# Patient Record
Sex: Female | Born: 1985 | Race: Black or African American | Hispanic: No | Marital: Single | Smoking: Current every day smoker
Health system: Southern US, Community
[De-identification: ages and names within clinical notes are randomized; demographics above are authoritative.]

## PROBLEM LIST (undated history)

## (undated) DIAGNOSIS — L732 Hidradenitis suppurativa: Secondary | ICD-10-CM

## (undated) DIAGNOSIS — E669 Obesity, unspecified: Secondary | ICD-10-CM

## (undated) DIAGNOSIS — R0683 Snoring: Secondary | ICD-10-CM

## (undated) DIAGNOSIS — E8881 Metabolic syndrome: Secondary | ICD-10-CM

## (undated) DIAGNOSIS — I10 Essential (primary) hypertension: Secondary | ICD-10-CM

## (undated) DIAGNOSIS — E88819 Insulin resistance, unspecified: Secondary | ICD-10-CM

## (undated) HISTORY — PX: NO PAST SURGERIES: SHX2092

## (undated) HISTORY — DX: Essential (primary) hypertension: I10

## (undated) HISTORY — DX: Obesity, unspecified: E66.9

## (undated) HISTORY — DX: Snoring: R06.83

---

## 2016-04-11 ENCOUNTER — Telehealth: Payer: Self-pay

## 2016-04-11 NOTE — Telephone Encounter (Signed)
I called pt to r/s her appt on 04/14/2016 because Dr. Vickey Hugerohmeier is not seeing pt's that day. No answer, left a message asking her to call me back. If pt calls back, please r/s her for a sleep consult with Dr. Vickey Hugerohmeier.

## 2016-04-14 ENCOUNTER — Institutional Professional Consult (permissible substitution): Payer: 59 | Admitting: Neurology

## 2016-04-27 ENCOUNTER — Ambulatory Visit (INDEPENDENT_AMBULATORY_CARE_PROVIDER_SITE_OTHER): Payer: 59 | Admitting: Neurology

## 2016-04-27 ENCOUNTER — Encounter: Payer: Self-pay | Admitting: Neurology

## 2016-04-27 VITALS — BP 146/96 | HR 92 | Resp 20 | Ht 65.0 in | Wt 283.0 lb

## 2016-04-27 DIAGNOSIS — G4733 Obstructive sleep apnea (adult) (pediatric): Secondary | ICD-10-CM | POA: Diagnosis not present

## 2016-04-27 DIAGNOSIS — E669 Obesity, unspecified: Secondary | ICD-10-CM

## 2016-04-27 DIAGNOSIS — R0689 Other abnormalities of breathing: Secondary | ICD-10-CM

## 2016-04-27 NOTE — Patient Instructions (Signed)
Home sleep test ordered 

## 2016-04-27 NOTE — Progress Notes (Signed)
SLEEP MEDICINE CLINIC   Provider:  Melvyn Novasarmen  Dillie Burandt, M D  Referring Provider: Glennis BrinkBowen, Karen E, DO Primary Care Physician:  Rhetta MuraHENRY ALTHISAR, PA-C  Chief Complaint  Patient presents with  . New Patient (Initial Visit)    snoring, never had stop breathing    HPI:  Michelle Morse is a 30 y.o. female , seen here as a referral  from Dr. Cathey EndowBowen for A sleep medicine evaluation.  Ms. Michelle Morse is followed by Dr. Cathey EndowBowen in her bariatric clinic, who has referred the patient because of a high-grade Mallampati, class III obesity with a BMI of 49, and a comorbidity of hypertension. The patient also has admitted and has been told that she snores. The patient stated that she always had trouble initiating sleep, and that her mind was often too busy to relax and enter sleep. She tends to worry a lot at night.  Michelle Morse started to lose weight briefly when she was prescribed phentermine in 2015  but it did not work well for her in the long-term. Actually, the patient reports that she was taken off phentermine also she had started to lose weight but her physician at the time didn't want her to be using this medication long-term. She just recently started the medication again and lost already 15 pounds over a period of 2 months. Besides stent to mind at 37.5 mg in the mornings she takes biotin, BuSpar 50 mg, hydrochlorothiazide 12.5 mg in the morning she is also on a birth control pill, Gilda.  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 She was worrying a lot about her clients and she worked in a stressful environment, witnessing some conditions that left their mark on her.   Chief complaint according to patient : "I have never slept easily "  Sleep habits are as follows: The patient usually likes to go to bed before 10 PM, but she struggles to fall asleep once in bed. The bedroom is core quiet and dark, she prefers to sleep on her  side, she uses for 5 pillows at night. She likes to sleep on her right side with the head of bed slightly elevated. She denies any restless legs, and once she is asleep she can stay asleep for 5 hours but tends to wake up in the early morning hours. Some nights she wakes up because she has to go to the bathroom of the night she wakes up spontaneously not sure why. Sometimes she may have a vivid dream but this is not a frequent occurrence and usually she does not suffer from nightmares, nocturnal panic or anxiety. Some nights it will take an hour or 2 to go back to sleep after an unwanted arousal from sleep, the nights she can fall asleep without a problem. She rises in the morning usually at 7:30 AM and her work begins at 8:30 AM. She has a brief commute. Some mornings she will wake with a dry mouth but usually not with headaches, not with any pain or discomfort. She will have breakfast at work usually oatmeal for breakfast, she does not drink caffeine or caffeine beverages.    Sleep medical history and family sleep history:  She has no history of sleepwalking, night terrors or enuresis. No parasomnia. 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   Review of Systems: Out of a complete 14 system review, the patient complains of only the following symptoms, and all other reviewed systems are negative.  She endorsed insomnia, snoring, headaches, and weight gain. But yet recently has lost some weight. She has been startled and wakes up finding herself supine feeling as if her throat closed up, sometimes choking.  Epworth score 3, Fatigue severity score 27  , depression score n/a    Social History   Social History  . Marital status: Single    Spouse name: N/A  . Number of children: N/A  . Years of education: N/A    Occupational History  . Not on file.   Social History Main Topics  . Smoking status: Current Every Day Smoker    Packs/day: 0.25  . Smokeless tobacco: Never Used  . Alcohol use No  . Drug use: No  . Sexual activity: Not on file   Other Topics Concern  . Not on file   Social History Narrative  . No narrative on file    Family History  Problem Relation Age of Onset  . Cancer Mother   . Hypertension Mother   . Cancer Father   . Hypertension Father   . Hypertension Sister     Past Medical History:  Diagnosis Date  . Hypertension   . Obesity   . Snoring     No past surgical history on file.  Current Outpatient Prescriptions  Medication Sig Dispense Refill  . Biotin 1 MG CAPS Take 1 mg by mouth daily.    . busPIRone (BUSPAR) 15 MG tablet Take 15 mg by mouth daily.     . hydrochlorothiazide (MICROZIDE) 12.5 MG capsule Take 12.5 mg by mouth daily.    . norethindrone-ethinyl estradiol (MICROGESTIN,JUNEL,LOESTRIN) 1-20 MG-MCG tablet Take 1 tablet by mouth daily.    . phentermine 37.5 MG capsule Take 37.5 mg by mouth every morning.     No current facility-administered medications for this visit.     Allergies as of 04/27/2016  . (No Known Allergies)    Vitals: BP (!) 146/96   Pulse 92   Resp 20   Ht 5\' 5"  (1.651 m)   Wt 283 lb (128.4 kg)   BMI 47.09 kg/m  Last Weight:  Wt Readings from Last 1 Encounters:  04/27/16 283 lb (128.4 kg)   ZOX:WRUEBMI:Body mass index is 47.09 kg/m.     Last Height:   Ht Readings from Last 1 Encounters:  04/27/16 5\' 5"  (1.651 m)    Physical exam:  General: The patient is awake, alert and appears not in acute distress. The patient is well groomed. Head: Normocephalic, atraumatic. Neck is supple. Mallampati 5,  neck circumference:15.5 . Nasal airflow patent , TMJ click is evident . Retrognathia is seen.  Cardiovascular:  Regular rate and rhythm , without  murmurs or carotid bruit, and without distended neck veins. Respiratory: Lungs  are clear to auscultation. Skin:  Without evidence of edema, or rash Trunk: BMI is 49 Neurologic exam :The patient is awake and alert, oriented to place and time.   Attention span & concentration ability appears normal.  Speech is fluent,  without dysarthria, dysphonia or aphasia. Mood and affect are appropriate.  Cranial nerves: Pupils are equal and briskly reactive to light. Funduscopic exam without  evidence of pallor or edema. Extraocular movements in vertical and horizontal planes intact . Visual fields by finger perimetry are intact. Hearing to finger rub  intact. Facial sensation intact to fine touch. Facial motor strength is symmetric and tongue and uvula move midline. Shoulder shrug was symmetrical.   Motor exam: Normal tone, muscle bulk and symmetric strength in all extremities. Sensory:  Fine touch, pinprick and vibration were tested in all extremities.  Coordination:  Finger-to-nose maneuver  normal without evidence of ataxia, dysmetria or tremor. Gait and station: Patient walks without assistive device and is able unassisted to climb up to the exam table. Strength within normal limits.Stance is stable and normal.  Deep tendon reflexes: in the  upper and lower extremities are attenuated, symmetric and intact. Babinski maneuver response is downgoing.  The patient was advised of the nature of the diagnosed sleep disorder , the treatment options and risks for general a health and wellness arising from not treating the condition.  I spent more than 45 minutes of face to face time with the patient. Greater than 50% of time was spent in counseling and coordination of care. We have discussed the diagnosis and differential and I answered the patient's questions.    Assessment:  After physical and neurologic examination, review of laboratory studies,  Personal review of imaging studies, reports of other /same  Imaging studies ,  Results of polysomnography/ neurophysiology testing and pre-existing  records as far as provided in visit., my assessment is   1)  I share Dr. Ovidio Kin concerns about this patient possibly having sleep apnea, there is a history of snoring, sleep choking and the most likely diagnosis is obstructive sleep apnea. Her risk factors are also manifested in a very high-grade Mallampati and BMI  2) I will asked this patient to undergo a home sleep test. I think this will be enough of a screening test to see if apnea is present. Today be significant sleep hypoxemia or very high degree of apnea found in her home sleep test I may ask her to return to the sleep lab for a CPAP titration. In mild the apnea cases without oxygen desaturation a dental device can be sufficient to treat snoring and apnea. The patient is already in bariatric treatment, and aware that her body mass index constitutes the highest risk factor for this condition.    Plan:  Treatment plan and additional workup :  HST   Rv after test.      Melvyn Novas MD  04/27/2016   CC: Glennis Brink, Do [address] [place], [place] [address]

## 2016-06-30 ENCOUNTER — Telehealth: Payer: Self-pay | Admitting: Neurology

## 2016-06-30 DIAGNOSIS — G4733 Obstructive sleep apnea (adult) (pediatric): Secondary | ICD-10-CM

## 2016-06-30 NOTE — Telephone Encounter (Signed)
Patient wants in lab sleep study and I have it approved.  I need an order for split.  Thanks

## 2016-07-11 ENCOUNTER — Ambulatory Visit (INDEPENDENT_AMBULATORY_CARE_PROVIDER_SITE_OTHER): Payer: 59 | Admitting: Neurology

## 2016-07-11 DIAGNOSIS — G4733 Obstructive sleep apnea (adult) (pediatric): Secondary | ICD-10-CM | POA: Diagnosis not present

## 2016-07-18 NOTE — Procedures (Signed)
PATIENT'S NAME:  Michelle Morse, Michelle Morse DOB:      November 24, 1985      MR#:    [id number]     DATE OF RECORDING: 07/11/2016 REFERRING M.D.:  Glennis BrinkKaren E Bowen, DO Study Performed:   Baseline Polysomnogram HISTORY:  Ms. Michelle Morse is followed by Dr. Cathey EndowBowen in her bariatric clinic, and was referred because of a high-grade Mallampati, class III obesity with a BMI of 49, and a comorbidity of hypertension. The patient also has been told that she snores and wakes up choking. The patient stated that she always had trouble initiating sleep, and that her mind was often too busy to relax and enter sleep. She tends to worry a lot at night.  Hypertension, Morbid Obesity and Snoring  The patient endorsed the Epworth Sleepiness Scale at 3/24 points.    The patient's weight 283 pounds with a height of 65 (inches), resulting in a BMI of 47. 2 kg/m2.  The patient's neck circumference measured 15.5 inches.  CURRENT MEDICATIONS: Biotin, Buspirone, Hydrochlorothiazide, Norethindrone-Ethinyl and Phentermine.   PROCEDURE:  This is a multichannel digital polysomnogram utilizing the Somnostar 11.2 system.  Electrodes and sensors were applied and monitored per AASM Specifications.   EEG, EOG, Chin and Limb EMG, were sampled at 200 Hz.  ECG, Snore and Nasal Pressure, Thermal Airflow, Respiratory Effort, CPAP Flow and Pressure, Oximetry was sampled at 50 Hz. Digital video and audio were recorded.      BASELINE STUDY Lights Out was at 22:30 and Lights On at 05:19.  Total recording time (TRT) was 409.5 minutes, with a total sleep time (TST) of 291 minutes.   The patient's sleep latency was 57 minutes.  REM latency was 107 minutes.  The sleep efficiency was 71.1 %.     SLEEP ARCHITECTURE: WASO (Wake after sleep onset) was 61.5 minutes.  There were 11 minutes in Stage N1, 202.5 minutes Stage N2, 0 minutes Stage N3 and 77.5 minutes in Stage REM.  The percentage of Stage N1 was 3.8%, Stage N2 was 69.6%, Stage N3 was 0% and Stage R (REM  sleep) was 26.6%.   RESPIRATORY ANALYSIS:  There were a total of 22 respiratory events:  0 obstructive apneas, 0 central apneas and 1 mixed apnea with a total of 1 apneas and 21 hypopneas with 0 respiratory event related arousals (RERAs).    The total APNEA/HYPOPNEA INDEX (AHI) was 4.5/hour and the total RESPIRATORY DISTURBANCE INDEX was 4.5 /hour.   18 events occurred in REM sleep and 7 events in NREM. The REM AHI was 13.9 /hour, versus a non-REM AHI of 1.1. The patient spent 54.5 minutes of total sleep time in the supine position and 237 minutes in non-supine.. The supine AHI was 6.6 versus a non-supine AHI of 4.1.  OXYGEN SATURATION & C02:  The Wake baseline 02 saturation was 97%, with the lowest being 87%. Time spent below 89% saturation equaled 1 minute.   PERIODIC LIMB MOVEMENTS:   The patient had a total of 0 Periodic Limb Movements.   Audio and video analysis did not show any abnormal or unusual movements, behaviors, phonations or vocalizations.  The patient took bathroom breaks. Snoring was noted. EKG was in keeping with normal sinus rhythm (NSR).  The arousals were noted as: 32 were spontaneous, 0 were associated with PLMs, and 21 were associated with respiratory events.  IMPRESSION: 1. Mild Obstructive Sleep Apnea (OSA) AHI 4.5 /hr. with REM sleep accentuation to an AHI REM of 14. No hypoxemia, no tachy-brady arrhythmia associated , only  snoring.  2. No Periodic Limb Movement Disorder (PLMD) 3. 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  RECOMMENDATIONS:  1. Advise to lose weight, diet and exercise if not contraindicated (BMI over 40). Avoid supine sleep.  2. Advise patient to avoid driving or operating hazardous machinery when sleepy. 3. You may obtain information regarding snoring treatments from your sleep doctor, and discuss ENT or dentist referral for snoring therapies.  4. Avoid caffeine-containing beverages and chocolate.  Consider dedicated sleep psychology referral if insomnia is of clinical concern.   5. A follow up appointment will be scheduled in the Sleep Clinic at [facility]. The referring provider will be notified of the results.      I certify that I have reviewed the entire raw data recording prior to the issuance of this report in accordance with the Standards of Accreditation of the American Academy of Sleep Medicine (AASM)    Melvyn Novas, MD 07-18-2016 Diplomat, American Board of Psychiatry and Neurology  Diplomat, American Board of Sleep Medicine Medical Director, [facility]

## 2016-07-20 ENCOUNTER — Telehealth: Payer: Self-pay

## 2016-07-20 NOTE — Telephone Encounter (Signed)
-----   Message from Melvyn Novasarmen Dohmeier, MD sent at 07/18/2016 12:37 PM EST ----- 1. Mild Obstructive Sleep Apnea (OSA) AHI 4.5 /hr. with REM sleep accentuation to an AHI REM of 14. No hypoxemia, no tachy-brady arrhythmia associated , only snoring.  2. No Periodic Limb Movement Disorder (PLMD) 3. 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  RECOMMENDATIONS:  1. Advise to lose weight, diet and exercise if not contraindicated (BMI over 40). Avoid supine sleep.  2. Advise patient to avoid driving or operating hazardous machinery when sleepy. 3. You may obtain information regarding snoring treatments from your sleep doctor, and discuss ENT or dentist referral for snoring therapies.  4. Avoid caffeine-containing beverages and chocolate. Consider dedicated sleep psychology referral if insomnia is of clinical concern.   5. A follow up appointment will be scheduled in the Sleep Clinic at [facility]. The referring provider will be notified of the results.

## 2016-07-20 NOTE — Telephone Encounter (Signed)
I spoke to patient and she is aware of results and recommendations. She will f/u with referring doctor. I have faxed sleep study to referring doctor

## 2019-02-22 DIAGNOSIS — N915 Oligomenorrhea, unspecified: Secondary | ICD-10-CM | POA: Diagnosis present

## 2019-02-26 DIAGNOSIS — E8881 Metabolic syndrome: Secondary | ICD-10-CM | POA: Diagnosis present

## 2019-02-26 DIAGNOSIS — E88819 Insulin resistance, unspecified: Secondary | ICD-10-CM | POA: Diagnosis present

## 2019-09-09 DIAGNOSIS — I10 Essential (primary) hypertension: Secondary | ICD-10-CM | POA: Diagnosis present

## 2020-10-01 ENCOUNTER — Other Ambulatory Visit: Payer: Self-pay

## 2020-10-01 ENCOUNTER — Emergency Department (HOSPITAL_COMMUNITY)
Admission: EM | Admit: 2020-10-01 | Discharge: 2020-10-01 | Discharge status: Against Medical Advice | Payer: 59 | Attending: Physician Assistant | Admitting: Physician Assistant

## 2020-10-01 ENCOUNTER — Encounter (HOSPITAL_COMMUNITY): Payer: Self-pay | Admitting: Emergency Medicine

## 2020-10-01 DIAGNOSIS — R1013 Epigastric pain: Secondary | ICD-10-CM | POA: Insufficient documentation

## 2020-10-01 DIAGNOSIS — Z5321 Procedure and treatment not carried out due to patient leaving prior to being seen by health care provider: Secondary | ICD-10-CM | POA: Insufficient documentation

## 2020-10-01 DIAGNOSIS — R1110 Vomiting, unspecified: Secondary | ICD-10-CM | POA: Insufficient documentation

## 2020-10-01 LAB — COMPREHENSIVE METABOLIC PANEL
ALT: 24 U/L (ref 0–44)
AST: 41 U/L (ref 15–41)
Albumin: 4.3 g/dL (ref 3.5–5.0)
Alkaline Phosphatase: 59 U/L (ref 38–126)
Anion gap: 11 (ref 5–15)
BUN: 7 mg/dL (ref 6–20)
CO2: 23 mmol/L (ref 22–32)
Calcium: 9.4 mg/dL (ref 8.9–10.3)
Chloride: 104 mmol/L (ref 98–111)
Creatinine, Ser: 0.85 mg/dL (ref 0.44–1.00)
GFR, Estimated: >60 mL/min (ref >60)
Glucose, Bld: 122 mg/dL — ABNORMAL HIGH (ref 70–99)
Potassium: 3.1 mmol/L — ABNORMAL LOW (ref 3.5–5.1)
Sodium: 138 mmol/L (ref 135–145)
Total Bilirubin: 0.8 mg/dL (ref 0.3–1.2)
Total Protein: 8.2 g/dL — ABNORMAL HIGH (ref 6.5–8.1)

## 2020-10-01 LAB — CBC WITH DIFFERENTIAL/PLATELET
Abs Immature Granulocytes: 0.18 10*3/uL — ABNORMAL HIGH (ref 0.00–0.07)
Basophils Absolute: 0 10*3/uL (ref 0.0–0.1)
Basophils Relative: 0 %
Eosinophils Absolute: 0 10*3/uL (ref 0.0–0.5)
Eosinophils Relative: 0 %
HCT: 41 % (ref 36.0–46.0)
Hemoglobin: 13.8 g/dL (ref 12.0–15.0)
Immature Granulocytes: 2 %
Lymphocytes Relative: 12 %
Lymphs Abs: 1.4 10*3/uL (ref 0.7–4.0)
MCH: 28.8 pg (ref 26.0–34.0)
MCHC: 33.7 g/dL (ref 30.0–36.0)
MCV: 85.6 fL (ref 80.0–100.0)
Monocytes Absolute: 0.8 10*3/uL (ref 0.1–1.0)
Monocytes Relative: 7 %
Neutro Abs: 9.5 10*3/uL — ABNORMAL HIGH (ref 1.7–7.7)
Neutrophils Relative %: 79 %
Platelets: 369 10*3/uL (ref 150–400)
RBC: 4.79 MIL/uL (ref 3.87–5.11)
RDW: 15.1 % (ref 11.5–15.5)
WBC: 11.9 10*3/uL — ABNORMAL HIGH (ref 4.0–10.5)
nRBC: 0 % (ref 0.0–0.2)

## 2020-10-01 LAB — I-STAT BETA HCG BLOOD, ED (MC, WL, AP ONLY): I-stat hCG, quantitative: <5 m[IU]/mL (ref <5)

## 2020-10-01 LAB — LIPASE, BLOOD: Lipase: 28 U/L (ref 11–51)

## 2020-10-01 MED ORDER — ONDANSETRON 4 MG PO TBDP: 4.0000 mg | ORAL_TABLET | Freq: Once | ORAL | Status: DC

## 2020-10-01 NOTE — ED Triage Notes (Signed)
Emergency Medicine Provider Triage Evaluation Note  Michelle Morse , a 35 y.o. female  was evaluated in triage.  Pt complains of vomiting and epigastric pain for the last 24 hours.  Review of Systems  Positive: As above Negative: Fevers, chills, flank pain, vaginal bleeding  Physical Exam  BP (!) 167/106 (BP Location: Right Arm)   Pulse 93   Temp 99.5 F (37.5 C) (Oral)   Resp 20   SpO2 100%  Gen:   Awake, no distress   HEENT:  Atraumatic  Resp:  Normal effort  Cardiac:  Normal rate  Abd:   Nondistended, nontender MSK:   Moves extremities without difficulty  Neuro:  Speech clear   Medical Decision Making  Medically screening exam initiated at 5:46 PM.  Appropriate orders placed.  Chevy Cuervo was informed that the remainder of the evaluation will be completed by another provider, this initial triage assessment does not replace that evaluation, and the importance of remaining in the ED until their evaluation is complete.  Clinical Impression     Mare Ferrari, PA-C 10/01/20 1748

## 2020-10-01 NOTE — ED Notes (Signed)
Pt called for registration x3 with no response. Pulled otf.

## 2020-10-01 NOTE — ED Triage Notes (Signed)
Pt c/o nausea/vomiting/diarrhea and epigastric pain that started Wednesday morning.

## 2020-10-02 ENCOUNTER — Emergency Department (HOSPITAL_COMMUNITY)
Admission: EM | Admit: 2020-10-02 | Discharge: 2020-10-02 | Disposition: A | Discharge status: Home / Self Care | Payer: 59 | Attending: Emergency Medicine | Admitting: Emergency Medicine

## 2020-10-02 ENCOUNTER — Encounter (HOSPITAL_COMMUNITY): Payer: Self-pay

## 2020-10-02 ENCOUNTER — Emergency Department (HOSPITAL_COMMUNITY)
Admission: EM | Admit: 2020-10-02 | Discharge: 2020-10-02 | Disposition: A | Discharge status: Home / Self Care | Payer: 59 | Source: Home / Self Care

## 2020-10-02 ENCOUNTER — Other Ambulatory Visit: Payer: Self-pay

## 2020-10-02 DIAGNOSIS — R079 Chest pain, unspecified: Secondary | ICD-10-CM | POA: Insufficient documentation

## 2020-10-02 DIAGNOSIS — Z79899 Other long term (current) drug therapy: Secondary | ICD-10-CM | POA: Insufficient documentation

## 2020-10-02 DIAGNOSIS — K529 Noninfective gastroenteritis and colitis, unspecified: Secondary | ICD-10-CM | POA: Diagnosis not present

## 2020-10-02 DIAGNOSIS — D72829 Elevated white blood cell count, unspecified: Secondary | ICD-10-CM | POA: Insufficient documentation

## 2020-10-02 DIAGNOSIS — E876 Hypokalemia: Secondary | ICD-10-CM | POA: Diagnosis not present

## 2020-10-02 DIAGNOSIS — R112 Nausea with vomiting, unspecified: Secondary | ICD-10-CM | POA: Insufficient documentation

## 2020-10-02 DIAGNOSIS — Z5321 Procedure and treatment not carried out due to patient leaving prior to being seen by health care provider: Secondary | ICD-10-CM | POA: Insufficient documentation

## 2020-10-02 DIAGNOSIS — R109 Unspecified abdominal pain: Secondary | ICD-10-CM | POA: Insufficient documentation

## 2020-10-02 DIAGNOSIS — I10 Essential (primary) hypertension: Secondary | ICD-10-CM | POA: Insufficient documentation

## 2020-10-02 DIAGNOSIS — F17200 Nicotine dependence, unspecified, uncomplicated: Secondary | ICD-10-CM | POA: Diagnosis not present

## 2020-10-02 DIAGNOSIS — R1110 Vomiting, unspecified: Secondary | ICD-10-CM | POA: Diagnosis present

## 2020-10-02 HISTORY — DX: Metabolic syndrome: E88.81

## 2020-10-02 HISTORY — DX: Insulin resistance, unspecified: E88.819

## 2020-10-02 LAB — URINALYSIS, ROUTINE W REFLEX MICROSCOPIC
Bilirubin Urine: NEGATIVE
Glucose, UA: NEGATIVE mg/dL
Ketones, ur: 80 mg/dL — AB
Leukocytes,Ua: NEGATIVE
Nitrite: NEGATIVE
Protein, ur: 30 mg/dL — AB
Specific Gravity, Urine: 1.016 (ref 1.005–1.030)
pH: 6 (ref 5.0–8.0)

## 2020-10-02 MED ORDER — LACTATED RINGERS IV BOLUS
1000.0000 mL | Freq: Once | INTRAVENOUS | Status: AC
  Administered 2020-10-02: 1000 mL via INTRAVENOUS

## 2020-10-02 MED ORDER — HYDROCHLOROTHIAZIDE 12.5 MG PO CAPS
12.5000 mg | ORAL_CAPSULE | Freq: Once | ORAL | Status: AC
  Administered 2020-10-02: 12.5 mg via ORAL
  Filled 2020-10-02: qty 1

## 2020-10-02 MED ORDER — ONDANSETRON 4 MG PO TBDP: 4.0000 mg | ORAL_TABLET | Freq: Three times a day (TID) | ORAL | 0 refills | Status: DC | PRN

## 2020-10-02 MED ORDER — POTASSIUM CHLORIDE ER 10 MEQ PO TBCR: 10.0000 meq | EXTENDED_RELEASE_TABLET | Freq: Two times a day (BID) | ORAL | 0 refills | Status: DC

## 2020-10-02 MED ORDER — ONDANSETRON HCL 4 MG/2ML IJ SOLN
4.0000 mg | Freq: Once | INTRAMUSCULAR | Status: AC
  Administered 2020-10-02: 4 mg via INTRAVENOUS
  Filled 2020-10-02: qty 2

## 2020-10-02 MED ORDER — POTASSIUM CHLORIDE CRYS ER 20 MEQ PO TBCR
40.0000 meq | EXTENDED_RELEASE_TABLET | Freq: Once | ORAL | Status: AC
  Administered 2020-10-02: 40 meq via ORAL
  Filled 2020-10-02: qty 2

## 2020-10-02 NOTE — ED Triage Notes (Signed)
Pt arrives VIA EMS from home. Pt has been vomiting for the past three days. She has been throwing up bile. Pt has been unable to drink for three days.

## 2020-10-02 NOTE — ED Provider Notes (Signed)
[facility] EMERGENCY DEPARTMENT Provider Note  CSN: [id number] Arrival date & time: 10/02/20 0404    History No chief complaint on file.   HPI  Michelle Morse is a 35 y.o. female with no significant PMH reports she has had 3 days of persistent vomiting, bilious material unable to keep down fluids. She had some diarrhea early in the illness but none since. She has not had any abdominal pain or fever. She has had some strong odor to her urine but no dysuria or frequency. She went to [facility] yesterday evening, had labs done but left before being placed into an exam room. She continued to feel worse as the night went on and so she called EMS who brought her here. She was given Zofran and IVF enroute with some improvement but continues to feel nauseated.    Past Medical History:  Diagnosis Date  . Hypertension   . Obesity   . Snoring     No past surgical history on file.  Family History  Problem Relation Age of Onset  . Cancer Mother   . Hypertension Mother   . Cancer Father   . Hypertension Father   . Hypertension Sister     Social History   Tobacco Use  . Smoking status: Current Every Day Smoker    Packs/day: 0.25  . Smokeless tobacco: Never Used  Substance Use Topics  . Alcohol use: No  . Drug use: No     Home Medications Prior to Admission medications   Medication Sig Start Date End Date Taking? Authorizing Provider  ondansetron (ZOFRAN ODT) 4 MG disintegrating tablet Take 1 tablet (4 mg total) by mouth every 8 (eight) hours as needed for nausea or vomiting. 10/02/20  Yes Pollyann Savoy, MD  potassium chloride (KLOR-CON) 10 MEQ tablet Take 1 tablet (10 mEq total) by mouth 2 (two) times daily for 5 days. 10/02/20 10/07/20 Yes Pollyann Savoy, MD  Biotin 1 MG CAPS Take 1 mg by mouth daily.    [provider]  busPIRone (BUSPAR) 15 MG tablet Take 15 mg by mouth daily.     [provider]  hydrochlorothiazide (MICROZIDE) 12.5 MG capsule Take  12.5 mg by mouth daily.    [provider]  norethindrone-ethinyl estradiol (MICROGESTIN,JUNEL,LOESTRIN) 1-20 MG-MCG tablet Take 1 tablet by mouth daily.    [provider]  phentermine 37.5 MG capsule Take 37.5 mg by mouth every morning.    [provider]     Allergies    Patient has no known allergies.   Review of Systems   Review of Systems A comprehensive review of systems was completed and negative except as noted in HPI.   Physical Exam BP (!) 158/130   Pulse 82   Temp 98.7 F (37.1 C) (Oral)   Resp 18   Ht 5\' 5"  (1.651 m)   Wt 128 kg   SpO2 100%   BMI 46.96 kg/m   Physical Exam Vitals and nursing note reviewed.  Constitutional:      Appearance: Normal appearance.  HENT:     Head: Normocephalic and atraumatic.     Nose: Nose normal.     Mouth/Throat:     Mouth: Mucous membranes are moist.  Eyes:     Extraocular Movements: Extraocular movements intact.     Conjunctiva/sclera: Conjunctivae normal.  Cardiovascular:     Rate and Rhythm: Normal rate.  Pulmonary:     Effort: Pulmonary effort is normal.     Breath sounds:  Normal breath sounds.  Abdominal:     General: Abdomen is flat. There is no distension.     Palpations: Abdomen is soft.     Tenderness: There is no abdominal tenderness. There is no guarding.  Musculoskeletal:        General: No swelling. Normal range of motion.     Cervical back: Neck supple.  Skin:    General: Skin is warm and dry.  Neurological:     General: No focal deficit present.     Mental Status: She is alert.  Psychiatric:        Mood and Affect: Mood normal.      ED Results / Procedures / Treatments   Labs (all labs ordered are listed, but only abnormal results are displayed) Labs Reviewed  URINALYSIS, ROUTINE W REFLEX MICROSCOPIC - Abnormal; Notable for the following components:      Result Value   Hgb urine dipstick SMALL (*)    Ketones, ur 80 (*)    Protein, ur 30 (*)    Bacteria, UA RARE  (*)    All other components within normal limits    EKG None  Radiology No results found.  Procedures Procedures  Medications Ordered in the ED Medications  hydrochlorothiazide (MICROZIDE) capsule 12.5 mg (has no administration in time range)  potassium chloride SA (KLOR-CON) CR tablet 40 mEq (has no administration in time range)  lactated ringers bolus 1,000 mL (1,000 mLs Intravenous New Bag/Given 10/02/20 0541)  ondansetron (ZOFRAN) injection 4 mg (4 mg Intravenous Given 10/02/20 0600)     MDM Rules/Calculators/A&P MDM Labs at [facility] reviewed, mild leukocytosis and mild hypokalemia, otherwise unremarkable. Abdomen is benign. Will give additional IVF and check a UA.   ED Course  I have reviewed the triage vital signs and the nursing notes.  Pertinent labs & imaging results that were available during my care of the patient were reviewed by me and considered in my medical decision making (see chart for details).  Clinical Course as of 10/02/20 0701  Fri Oct 02, 2020  0557 UA with ketones consistent with dehydration. Will attempt PO trial after IVF complete.  [CS]  6759 Patient feeling better after Zofran and additional IVF. She has not been able to keep her BP meds down in the last few days so she is now hypertensive but tolerating PO fluids so will give her usual HCTZ and plan discharge home with Rx for Zofran, encouraged PO fluids and advance diet as tolerated.  [CS]    Clinical Course User Index [CS] Pollyann Savoy, MD    Final Clinical Impression(s) / ED Diagnoses Final diagnoses:  Gastroenteritis  Hypokalemia    Rx / DC Orders ED Discharge Orders         Ordered    ondansetron (ZOFRAN ODT) 4 MG disintegrating tablet  Every 8 hours PRN        10/02/20 0659    potassium chloride (KLOR-CON) 10 MEQ tablet  2 times daily        10/02/20 0659           Pollyann Savoy, MD 10/02/20 [phone]

## 2020-10-02 NOTE — ED Triage Notes (Addendum)
Per EMS- patient was seen yesterday for N/v and abdominal pain x 3 days. Patient reports that she was told she had a virus and was dehydrated.  Today, the patient vomited x 1 only with a dime-sized amount of blood.   Patient added in triage that she has been having mid chest pain and constantly "burping"

## 2020-10-02 NOTE — ED Triage Notes (Signed)
Emergency Medicine Provider Triage Evaluation Note  Michelle Morse , a 35 y.o. female  was evaluated in triage.  Pt complains of ongoing vomiting and abdominal pain, symptoms started 3 days ago, was in the emergency room early this morning and given 3 hours of IV fluids however still feels dehydrated.  Reports taking her Zofran as prescribed without relief had 1 episode of emesis with a few specks of red blood in it is not on blood thinners.  Review of Systems  Positive: Vomiting, abdominal pain Negative: Fever  Physical Exam  BP (!) 164/109 (BP Location: Left Arm)   Pulse 77   Temp 99.1 F (37.3 C) (Oral)   Resp 18   Ht 5\' 5"  (1.651 m)   Wt 128 kg   LMP 09/25/2020   SpO2 100%   BMI 46.96 kg/m  Gen:   Awake, no distress   HEENT:  Atraumatic  Resp:  Normal effort  Cardiac:  Normal rate  Abd:   Nondistended, nontender  MSK:   Moves extremities without difficulty  Neuro:  Speech clear   Medical Decision Making  Medically screening exam initiated at 4:05 PM.  Appropriate orders placed.  Brithany Morse was informed that the remainder of the evaluation will be completed by another provider, this initial triage assessment does not replace that evaluation, and the importance of remaining in the ED until their evaluation is complete.  Clinical Impression     11/25/2020, PA-C 10/02/20 1606

## 2020-10-02 NOTE — ED Notes (Addendum)
MD notified of pt BP 

## 2020-10-04 ENCOUNTER — Other Ambulatory Visit: Payer: Self-pay

## 2020-10-04 ENCOUNTER — Emergency Department (HOSPITAL_COMMUNITY): Payer: 59

## 2020-10-04 ENCOUNTER — Observation Stay (HOSPITAL_COMMUNITY)
Admission: EM | Admit: 2020-10-04 | Discharge: 2020-10-07 | Disposition: A | Discharge status: Home / Self Care | Payer: 59 | Attending: Internal Medicine | Admitting: Internal Medicine

## 2020-10-04 ENCOUNTER — Encounter (HOSPITAL_COMMUNITY): Payer: Self-pay | Admitting: Emergency Medicine

## 2020-10-04 DIAGNOSIS — N179 Acute kidney failure, unspecified: Secondary | ICD-10-CM | POA: Insufficient documentation

## 2020-10-04 DIAGNOSIS — E876 Hypokalemia: Secondary | ICD-10-CM | POA: Diagnosis not present

## 2020-10-04 DIAGNOSIS — F17210 Nicotine dependence, cigarettes, uncomplicated: Secondary | ICD-10-CM | POA: Diagnosis not present

## 2020-10-04 DIAGNOSIS — I10 Essential (primary) hypertension: Secondary | ICD-10-CM | POA: Diagnosis present

## 2020-10-04 DIAGNOSIS — K529 Noninfective gastroenteritis and colitis, unspecified: Secondary | ICD-10-CM | POA: Diagnosis not present

## 2020-10-04 DIAGNOSIS — L732 Hidradenitis suppurativa: Secondary | ICD-10-CM | POA: Diagnosis present

## 2020-10-04 DIAGNOSIS — R1013 Epigastric pain: Secondary | ICD-10-CM

## 2020-10-04 DIAGNOSIS — E871 Hypo-osmolality and hyponatremia: Secondary | ICD-10-CM

## 2020-10-04 DIAGNOSIS — Z20822 Contact with and (suspected) exposure to covid-19: Secondary | ICD-10-CM | POA: Insufficient documentation

## 2020-10-04 DIAGNOSIS — Z79899 Other long term (current) drug therapy: Secondary | ICD-10-CM | POA: Diagnosis not present

## 2020-10-04 DIAGNOSIS — E669 Obesity, unspecified: Secondary | ICD-10-CM

## 2020-10-04 DIAGNOSIS — R7401 Elevation of levels of liver transaminase levels: Secondary | ICD-10-CM | POA: Insufficient documentation

## 2020-10-04 DIAGNOSIS — E88819 Insulin resistance, unspecified: Secondary | ICD-10-CM | POA: Diagnosis present

## 2020-10-04 DIAGNOSIS — E8881 Metabolic syndrome: Secondary | ICD-10-CM | POA: Diagnosis present

## 2020-10-04 DIAGNOSIS — R1110 Vomiting, unspecified: Secondary | ICD-10-CM

## 2020-10-04 DIAGNOSIS — R112 Nausea with vomiting, unspecified: Secondary | ICD-10-CM

## 2020-10-04 DIAGNOSIS — E878 Other disorders of electrolyte and fluid balance, not elsewhere classified: Secondary | ICD-10-CM | POA: Insufficient documentation

## 2020-10-04 DIAGNOSIS — R0602 Shortness of breath: Secondary | ICD-10-CM

## 2020-10-04 HISTORY — DX: Hidradenitis suppurativa: L73.2

## 2020-10-04 LAB — CBC
HCT: 43.7 % (ref 36.0–46.0)
HCT: 38.9 % (ref 36.0–46.0)
Hemoglobin: 14.9 g/dL (ref 12.0–15.0)
Hemoglobin: 13.4 g/dL (ref 12.0–15.0)
MCH: 29.2 pg (ref 26.0–34.0)
MCH: 28.5 pg (ref 26.0–34.0)
MCHC: 34.1 g/dL (ref 30.0–36.0)
MCHC: 34.4 g/dL (ref 30.0–36.0)
MCV: 85.5 fL (ref 80.0–100.0)
MCV: 82.8 fL (ref 80.0–100.0)
Platelets: 307 10*3/uL (ref 150–400)
Platelets: 285 10*3/uL (ref 150–400)
RBC: 5.11 MIL/uL (ref 3.87–5.11)
RBC: 4.7 MIL/uL (ref 3.87–5.11)
RDW: 14.4 % (ref 11.5–15.5)
RDW: 14.2 % (ref 11.5–15.5)
WBC: 6.5 10*3/uL (ref 4.0–10.5)
WBC: 5.4 10*3/uL (ref 4.0–10.5)
nRBC: 0 % (ref 0.0–0.2)
nRBC: 0 % (ref 0.0–0.2)

## 2020-10-04 LAB — URINALYSIS, ROUTINE W REFLEX MICROSCOPIC
Glucose, UA: NEGATIVE mg/dL
Hgb urine dipstick: NEGATIVE
Ketones, ur: 20 mg/dL — AB
Leukocytes,Ua: NEGATIVE
Nitrite: NEGATIVE
Protein, ur: 100 mg/dL — AB
Specific Gravity, Urine: 1.042 — ABNORMAL HIGH (ref 1.005–1.030)
pH: 5 (ref 5.0–8.0)

## 2020-10-04 LAB — I-STAT BETA HCG BLOOD, ED (MC, WL, AP ONLY): I-stat hCG, quantitative: <5 m[IU]/mL (ref <5)

## 2020-10-04 LAB — COMPREHENSIVE METABOLIC PANEL
ALT: 39 U/L (ref 0–44)
AST: 43 U/L — ABNORMAL HIGH (ref 15–41)
Albumin: 3.9 g/dL (ref 3.5–5.0)
Alkaline Phosphatase: 57 U/L (ref 38–126)
Anion gap: 13 (ref 5–15)
BUN: 13 mg/dL (ref 6–20)
CO2: 22 mmol/L (ref 22–32)
Calcium: 8.8 mg/dL — ABNORMAL LOW (ref 8.9–10.3)
Chloride: 98 mmol/L (ref 98–111)
Creatinine, Ser: 1.04 mg/dL — ABNORMAL HIGH (ref 0.44–1.00)
GFR, Estimated: >60 mL/min (ref >60)
Glucose, Bld: 96 mg/dL (ref 70–99)
Potassium: 3.4 mmol/L — ABNORMAL LOW (ref 3.5–5.1)
Sodium: 133 mmol/L — ABNORMAL LOW (ref 135–145)
Total Bilirubin: 1.5 mg/dL — ABNORMAL HIGH (ref 0.3–1.2)
Total Protein: 7.6 g/dL (ref 6.5–8.1)

## 2020-10-04 LAB — LIPASE, BLOOD: Lipase: 46 U/L (ref 11–51)

## 2020-10-04 LAB — SARS CORONAVIRUS 2 (TAT 6-24 HRS): SARS Coronavirus 2: NEGATIVE

## 2020-10-04 LAB — MAGNESIUM: Magnesium: 2.1 mg/dL (ref 1.7–2.4)

## 2020-10-04 LAB — CREATININE, SERUM
Creatinine, Ser: 0.93 mg/dL (ref 0.44–1.00)
GFR, Estimated: >60 mL/min (ref >60)

## 2020-10-04 MED ORDER — HYDROCHLOROTHIAZIDE 12.5 MG PO CAPS
12.5000 mg | ORAL_CAPSULE | Freq: Every day | ORAL | Status: DC
  Administered 2020-10-04 – 2020-10-05 (×2): 12.5 mg via ORAL
  Filled 2020-10-04 (×3): qty 1

## 2020-10-04 MED ORDER — POTASSIUM CHLORIDE IN NACL 40-0.9 MEQ/L-% IV SOLN
INTRAVENOUS | Status: AC
  Filled 2020-10-04 (×2): qty 1000

## 2020-10-04 MED ORDER — DOXYLAMINE SUCCINATE (SLEEP) 25 MG PO TABS
25.0000 mg | ORAL_TABLET | Freq: Every evening | ORAL | Status: DC | PRN
  Administered 2020-10-04 – 2020-10-06 (×3): 25 mg via ORAL
  Filled 2020-10-04 (×5): qty 1

## 2020-10-04 MED ORDER — SODIUM CHLORIDE 0.9 % IV BOLUS
1000.0000 mL | Freq: Once | INTRAVENOUS | Status: AC
  Administered 2020-10-04: 1000 mL via INTRAVENOUS

## 2020-10-04 MED ORDER — GUAIFENESIN 100 MG/5ML PO SOLN
5.0000 mL | ORAL | Status: DC | PRN
  Administered 2020-10-04 – 2020-10-05 (×2): 100 mg via ORAL
  Filled 2020-10-04 (×2): qty 5

## 2020-10-04 MED ORDER — ACETAMINOPHEN 650 MG RE SUPP: 650.0000 mg | Freq: Four times a day (QID) | RECTAL | Status: DC | PRN

## 2020-10-04 MED ORDER — SODIUM CHLORIDE 0.9% FLUSH
3.0000 mL | Freq: Two times a day (BID) | INTRAVENOUS | Status: DC
  Administered 2020-10-05 – 2020-10-06 (×2): 3 mL via INTRAVENOUS

## 2020-10-04 MED ORDER — ONDANSETRON HCL 4 MG PO TABS: 4.0000 mg | ORAL_TABLET | Freq: Four times a day (QID) | ORAL | Status: DC | PRN

## 2020-10-04 MED ORDER — IOHEXOL 300 MG/ML  SOLN
100.0000 mL | Freq: Once | INTRAMUSCULAR | Status: AC | PRN
  Administered 2020-10-04: 100 mL via INTRAVENOUS

## 2020-10-04 MED ORDER — ONDANSETRON HCL 4 MG/2ML IJ SOLN
4.0000 mg | Freq: Once | INTRAMUSCULAR | Status: AC
  Administered 2020-10-04: 4 mg via INTRAVENOUS
  Filled 2020-10-04: qty 2

## 2020-10-04 MED ORDER — ONDANSETRON HCL 4 MG/2ML IJ SOLN: 4.0000 mg | Freq: Four times a day (QID) | INTRAMUSCULAR | Status: DC | PRN

## 2020-10-04 MED ORDER — ONDANSETRON 4 MG PO TBDP: 8.0000 mg | ORAL_TABLET | Freq: Once | ORAL | Status: DC

## 2020-10-04 MED ORDER — ALUM & MAG HYDROXIDE-SIMETH 200-200-20 MG/5ML PO SUSP
30.0000 mL | Freq: Once | ORAL | Status: AC
  Administered 2020-10-04: 30 mL via ORAL
  Filled 2020-10-04: qty 30

## 2020-10-04 MED ORDER — LIDOCAINE VISCOUS HCL 2 % MT SOLN
15.0000 mL | Freq: Once | OROMUCOSAL | Status: AC
  Administered 2020-10-04: 15 mL via ORAL
  Filled 2020-10-04: qty 15

## 2020-10-04 MED ORDER — ACETAMINOPHEN 325 MG PO TABS: 650.0000 mg | ORAL_TABLET | Freq: Four times a day (QID) | ORAL | Status: DC | PRN

## 2020-10-04 MED ORDER — ENOXAPARIN SODIUM 60 MG/0.6ML ~~LOC~~ SOLN
60.0000 mg | SUBCUTANEOUS | Status: DC
  Administered 2020-10-04 – 2020-10-06 (×3): 60 mg via SUBCUTANEOUS
  Filled 2020-10-04 (×3): qty 0.6

## 2020-10-04 NOTE — Discharge Instructions (Addendum)
Your work-up today was reassuring, as we discussed you most likely have dehydration due to a viral GI bug, most likely food poisoning.  Continue to stay hydrated, use the attached instructions on rehydration.  If you continue to feel nauseous and the Zofran not working I prescribed you Phenergan which you can take, this is a suppository as we discussed.  Try and stick to a bland diet, and use the attached instructions.  I also attached your CT with the findings we discussed, referred you to a back specialist if you want to speak to them about your findings, you can also discuss this with your PCP.  Please follow-up with your PCP in the next couple of days.  If you have any new worsening concerning symptoms please come back to the emergency department.  Your blood pressure was also elevated today, I want you to follow-up with your PCP in regards to this as well.  IMPRESSION:  1. Fluid within segments of the nondistended small bowel. This can  be a secondary sign of a mild enteritis of infectious or  inflammatory nature. No bowel wall thickening or mesenteric  inflammation to confirm an enteritis. No bowel obstruction.  2. Extensive colonic diverticulosis, but no evidence of acute  diverticulitis.  3. Fatty infiltration of the liver.  4. Prominent Disc-osteophytic bulge at L4-5 is causing at least  moderate central canal stenosis and possible associated nerve root  impingement. If any radiculopathic symptoms, would consider  nonemergent lumbar spine MRI for further characterization.

## 2020-10-04 NOTE — Progress Notes (Signed)
Patient admitted to room. Alert and oriented x4, No pain or discomfort, no n/v at this time. Skin dry and warm to touch without any issues noted.

## 2020-10-04 NOTE — H&P (Signed)
History and Physical    Michelle Morse ZCH:[id number] DOB: 08-Sep-1985 DOA: 10/04/2020  PCP: Courtney Paris, NP  Patient coming from: Home via EMS  Chief Complaint: Intractable vomiting, diarrhea  HPI: Michelle Morse is a 35 y.o. female with medical history significant of HTN, HS, insulin resistance, obesity who presents for intractable nausea and vomiting.  She notes that her symptoms started on Tuesday of last week.  She ate food from a street vendor and felt this might be the cause.  She noted that she also takes Ozempic and thought this might be related, but she usually only has 1-2 hours of nausea with that and this was longer than usual.  She then developed vomiting and diarrhea.  She had this for 2 days without relief.  She came to the hospital on Thursday and was prescribed potassium and phenergan PR.  She continued to take these medications, however, her symptoms did not improve.  She came back to [facility] ED the next day but left before being seen.  She then presented again today due to development of weakness, lightheadedness and feeling like she was severely dehydrated.  She has not been able to tolerate solid food.  She last ate some chicken broth last evening and has tolerated some water.  She has not had a bowel movement for 2 days.  She noticed some blood in her emesis, which prompted her to get re-evaluated.  She has developed a hoarse voice with increased mucus.  She has been taking in pediasure clear.  She further notes increased burping, some mild chest pain which improved with a GI cocktail and blurry vision.  She has not had symptoms like this before.   ED Course: In the ED, she was noted to have an increased Cr from three days ago (1.04), continued low K at 3.4 and a mild bump in her AST.  Her WBC and H/H were within normal limits.  She has some white cells in her urine and bilirubin, not clearly infected.  She had a CT abdomen which showed a mild enteritis.    Review of Systems:  As per HPI otherwise all other systems reviewed and are negative.  Past Medical History:  Diagnosis Date  . Hidradenitis suppurativa   . Hypertension   . Insulin resistance   . Obesity   . Snoring     Past Surgical History:  Procedure Laterality Date  . NO PAST SURGERIES      Social History  reports that she has been smoking cigarettes. She has been smoking about 0.25 packs per day. She has never used smokeless tobacco. She reports that she does not drink alcohol and does not use drugs. She has not been smoking in the past few days.    No Known Allergies - she noted the development of swelling of the left side of her lips with taking the promethazine PR, but not sure if this was related to the vomiting.   Family History  Problem Relation Age of Onset  . Cancer Mother   . Hypertension Mother   . Cancer Father   . Hypertension Father   . Hypertension Sister     Prior to Admission medications   Medication Sig Start Date End Date Taking? Authorizing Provider  Biotin 1 MG CAPS Take 1 mg by mouth daily.    [provider]         hydrochlorothiazide (MICROZIDE) 12.5 MG capsule Take 12.5 mg by mouth daily.    [provider]  norethindrone-ethinyl estradiol (MICROGESTIN,JUNEL,LOESTRIN) 1-20 MG-MCG tablet Take 1 tablet by mouth daily.    [provider]  ondansetron (ZOFRAN ODT) 4 MG disintegrating tablet Take 1 tablet (4 mg total) by mouth every 8 (eight) hours as needed for nausea or vomiting. 10/02/20   Pollyann SavoySheldon, Charles B, MD         potassium chloride (KLOR-CON) 10 MEQ tablet Take 1 tablet (10 mEq total) by mouth 2 (two) times daily for 5 days. 10/02/20 10/07/20  Pollyann SavoySheldon, Charles B, MD  Ozempic SQ 1mg  weekly Doxycycline - not taking, prescribed for HS Promethazine PR - recently taking Phentermine - not taking due to vomiting  Physical Exam: Constitutional: NAD, calm, comfortable Vitals:   10/04/20 1130 10/04/20 1215 10/04/20 1302 10/04/20 1315   BP: (!) 160/115 (!) 160/116 (!) 165/116 (!) 141/97  Pulse: 69 79 80 75  Resp:   15   Temp:      TempSrc:      SpO2: 93% 100% 100% 99%   Eyes: PERRL, lids normal, conjunctivae injected bilaterally, no scleral icterus ENMT: Mucous membranes are dry, posterior pharynx is clear Neck: normal, supple Respiratory: CTAB, no wheezing or rales.  She was saturating 100% on room air.  Cardiovascular: RR, NR, no murmurs or rubs Abdomen: Decreased BS, NT, ND Musculoskeletal: No contractures, normal muscle tone Skin: no rashes, ulcers on exposed skin. She does have some healing lesions on her lips, upper left and lower left.  No swelling of the lips.  Possibly related to an outbreak or related to phenergan use.  Neurologic: CN 2-12 grossly intact. Moving all extremities without issues, no change in sensation.  Psychiatric: Normal judgment and insight. Alert and oriented x 3. Normal mood.    Labs on Admission: I have personally reviewed following labs and imaging studies  CBC: Recent Labs  Lab 10/01/20 1748 10/04/20 0756  WBC 11.9* 6.5  NEUTROABS 9.5*  --   HGB 13.8 14.9  HCT 41.0 43.7  MCV 85.6 85.5  PLT 369 307    Basic Metabolic Panel: Recent Labs  Lab 10/01/20 1748 10/04/20 0756  NA 138 133*  K 3.1* 3.4*  CL 104 98  CO2 23 22  GLUCOSE 122* 96  BUN 7 13  CREATININE 0.85 1.04*  CALCIUM 9.4 8.8*  MG  --  2.1    GFR: Estimated Creatinine Clearance: 101.8 mL/min (A) (by C-G formula based on SCr of 1.04 mg/dL (H)).  Liver Function Tests: Recent Labs  Lab 10/01/20 1748 10/04/20 0756  AST 41 43*  ALT 24 39  ALKPHOS 59 57  BILITOT 0.8 1.5*  PROT 8.2* 7.6  ALBUMIN 4.3 3.9    Urine analysis:    Component Value Date/Time   COLORURINE AMBER (A) 10/04/2020 0756   APPEARANCEUR HAZY (A) 10/04/2020 0756   LABSPEC 1.042 (H) 10/04/2020 0756   PHURINE 5.0 10/04/2020 0756   GLUCOSEU NEGATIVE 10/04/2020 0756   HGBUR NEGATIVE 10/04/2020 0756   BILIRUBINUR MODERATE (A)  10/04/2020 0756   KETONESUR 20 (A) 10/04/2020 0756   PROTEINUR 100 (A) 10/04/2020 0756   NITRITE NEGATIVE 10/04/2020 0756   LEUKOCYTESUR NEGATIVE 10/04/2020 0756    Radiological Exams on Admission: CT Abdomen Pelvis W Contrast  Result Date: 10/04/2020 CLINICAL DATA:  Epigastric pain, vomiting. EXAM: CT ABDOMEN AND PELVIS WITH CONTRAST TECHNIQUE: Multidetector CT imaging of the abdomen and pelvis was performed using the standard protocol following bolus administration of intravenous contrast. CONTRAST:  100mL OMNIPAQUE IOHEXOL 300 MG/ML  SOLN COMPARISON:  None. FINDINGS: Lower  chest: No acute abnormality. Hepatobiliary: Liver is diffusely low in density suggesting fatty infiltration. No focal mass or lesion within the liver. Gallbladder appears normal. No bile duct dilatation is seen. Pancreas: Unremarkable. No pancreatic ductal dilatation or surrounding inflammatory changes. Spleen: Normal in size without focal abnormality. Adrenals/Urinary Tract: Kidneys are unremarkable without mass, stone or hydronephrosis. No perinephric fluid. No ureteral or bladder calculi are identified. Bladder is unremarkable, partially decompressed. Stomach/Bowel: No dilated large or small bowel loops. Fairly extensive diverticulosis throughout the descending and sigmoid colon but no focal inflammatory changes seen to suggest acute diverticulitis. No evidence of bowel wall inflammation elsewhere. Fluid is present within segments of the nondistended small bowel. Stomach is unremarkable, partially decompressed. Appendix is normal. Vascular/Lymphatic: No significant vascular findings are present. No enlarged abdominal or pelvic lymph nodes. Reproductive: Uterus and bilateral adnexa are unremarkable. Other: No free fluid or abscess collection. No free intraperitoneal air. Musculoskeletal: No acute osseous abnormality. Disc-osteophytic bulge at L4-5 is causing moderate central canal stenosis and possible associated nerve root  impingement. Umbilical abdominal wall hernia which contains fat only. IMPRESSION: 1. Fluid within segments of the nondistended small bowel. This can be a secondary sign of a mild enteritis of infectious or inflammatory nature. No bowel wall thickening or mesenteric inflammation to confirm an enteritis. No bowel obstruction. 2. Extensive colonic diverticulosis, but no evidence of acute diverticulitis. 3. Fatty infiltration of the liver. 4. 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 If any radiculopathic symptoms, would consider nonemergent lumbar spine MRI for further characterization. Electronically Signed   By: Bary Richard M.D.   On: 10/04/2020 10:24    EKG: Independently reviewed. Normal sinus rhythm, P waves appear normal. No ST changes  Assessment/Plan  Vomiting (intractable) and diarrhea, enteritis - Given course, this is likely viral in nature, vs. Food borne.  She has not had any further diarrhea, making a bacterial enteritis less likely.  No recent antibiotics (doxycycline is on her med list, but she has not been taking) - Will check hepatitis A to evaluate for course, though will not change treatment at this time, discussed with patient - IVF with NS for dehydration and very mild change to her Cr (baseline 0.8 and up to 1.04, does not meet criteria for AKI) - NPO for now, sips and chips okay - Hold Ozempic and phentermine - Zofran IV and PO, would not give further phenergan  Mild hyponatremia Mild Hypokalemia Mild Hypocalcemia Related to vomiting above - IVF with KCL of 40 meq in place - Trend with daily BMET    Essential hypertension - BP is mildly elevated, will start half dose of her home hctz if she is able to tolerate - Can given PRN medication if she develops accelerated HTN or becomes symptomatic     Insulin resistance Obesity - She is on ozempic SQ and phentermine.  She has  not taken phentermine this week, but did take Ozempic last weekend - Will hold these medications at this time until she is improved from above.     Hidradenitis suppurativa - No current outbreak, not on therapy, prescribed doxycycline, but does not take it.  - Monitor   DVT prophylaxis: Lovenox  Code Status:   Full  Family Communication:  Mother at bedside  Disposition Plan:   Patient is from:  Home  Anticipated DC to:  Home  Anticipated DC date:  10/05/20  Anticipated DC barriers: Improvement in vomiting  Consults called:  None Admission status:  Obs, Med Surg   Severity of Illness: The appropriate patient status for this patient is OBSERVATION. Observation status is judged to be reasonable and necessary in order to provide the required intensity of service to ensure the patient's safety. The patient's presenting symptoms, physical exam findings, and initial radiographic and laboratory data in the context of their medical condition is felt to place them at decreased risk for further clinical deterioration. Furthermore, it is anticipated that the patient will be medically stable for discharge from the hospital within 2 midnights of admission. The following factors support the patient status of observation.   " The patient's presenting symptoms include intractable vomiting. " The physical exam findings include dry MM. " The initial radiographic and laboratory data are Enteritis on CT scan, mild electrolyte abnormalities.      Debe Coder MD [facility]  How to contact the [facility] Attending or Consulting provider 7A - 7P or covering provider during after hours 7P -7A, for this patient?   1. Check the care team in [facility] and look for a) attending/consulting [facility] provider listed and b) the [facility] team listed 2. Log into [email or web address] and use [place]'s universal password to access. If you do not have the password, please contact the hospital operator. 3. Locate the [facility] provider you  are looking for under [facility] and page to a number that you can be directly reached. 4. If you still have difficulty reaching the provider, please page the [facility] (Director on Call) for the Hospitalists listed on amion for assistance.  10/04/2020, 3:48 PM

## 2020-10-04 NOTE — ED Triage Notes (Signed)
Pt returns to ED via [facility] from home.  Reports ongoing vomiting x 5 days.  Seen in ED 4/14 and 4/15 for same.  Taking Zofran without relief.

## 2020-10-04 NOTE — ED Provider Notes (Signed)
[facility] EMERGENCY DEPARTMENT Provider Note   CSN: [id number] Arrival date & time: 10/04/20  6440     History No chief complaint on file.   Michelle Morse is a 35 y.o. female with past medical history of hypertension, OSA that presents emergency department today for nausea vomiting and abdominal pain for the past 5 days.  Patient was seen here in the emergency department 2 days ago for the same.  Patient states that she was discharged home, and she started vomiting again.  States that she has been able to keep anything down for 5 days.  Bilious vomit. States that she is continuously vomiting, 1 episode of vomiting 2 days ago did have some streaks of blood, has not occurred again.  No blood thinners.  States that she has been taking the home Zofran without relief.  States that her urine looks like  " bourbon."  States that she is also having a burning sensation in her epigastric area.  States that the pain is a 4 out of 10, does not radiate anywhere.  No back pain or chest pain.  No shortness of breath.  Denies any abdominal surgeries.  Denies any fevers.  No vaginal bleeding, vaginal discharge or pelvic pain.  Denies any dysuria or hematuria, however does admit to decreased amounts of urine.  States that earlier in the illness she did have some diarrhea, this has resolved.  States that she does think that she ate some bad [place] salad about 5 days ago and this started.  No sick contacts.  Denies any substance use, marijuana use or alcohol use.  HPI     Past Medical History:  Diagnosis Date  . Hypertension   . Insulin resistance   . Obesity   . Snoring     Patient Active Problem List   Diagnosis Date Noted  . OSA (obstructive sleep apnea) 04/27/2016  . Super obese 04/27/2016  . Sleep related choking sensation 04/27/2016    History reviewed. No pertinent surgical history.   OB History   No obstetric history on file.     Family History  Problem Relation  Age of Onset  . Cancer Mother   . Hypertension Mother   . Cancer Father   . Hypertension Father   . Hypertension Sister     Social History   Tobacco Use  . Smoking status: Current Every Day Smoker    Packs/day: 0.25    Types: Cigarettes  . Smokeless tobacco: Never Used  Vaping Use  . Vaping Use: Never used  Substance Use Topics  . Alcohol use: No  . Drug use: No    Home Medications Prior to Admission medications   Medication Sig Start Date End Date Taking? Authorizing Provider  Biotin 1 MG CAPS Take 1 mg by mouth daily.    [provider]  busPIRone (BUSPAR) 15 MG tablet Take 15 mg by mouth daily.     [provider]  hydrochlorothiazide (MICROZIDE) 12.5 MG capsule Take 12.5 mg by mouth daily.    [provider]  norethindrone-ethinyl estradiol (MICROGESTIN,JUNEL,LOESTRIN) 1-20 MG-MCG tablet Take 1 tablet by mouth daily.    [provider]  ondansetron (ZOFRAN ODT) 4 MG disintegrating tablet Take 1 tablet (4 mg total) by mouth every 8 (eight) hours as needed for nausea or vomiting. 10/02/20   Pollyann Savoy, MD  phentermine 37.5 MG capsule Take 37.5 mg by mouth every morning.    [provider]  potassium chloride (KLOR-CON) 10  MEQ tablet Take 1 tablet (10 mEq total) by mouth 2 (two) times daily for 5 days. 10/02/20 10/07/20  Pollyann SavoySheldon, Charles B, MD    Allergies    Patient has no known allergies.  Review of Systems   Review of Systems  Constitutional: Negative for chills, diaphoresis, fatigue and fever.  HENT: Negative for congestion, sore throat and trouble swallowing.   Eyes: Negative for pain and visual disturbance.  Respiratory: Negative for cough, shortness of breath and wheezing.   Cardiovascular: Negative for chest pain, palpitations and leg swelling.  Gastrointestinal: Positive for abdominal pain, nausea and vomiting. Negative for abdominal distention and diarrhea.  Genitourinary: Negative for difficulty urinating.   Musculoskeletal: Negative for back pain, neck pain and neck stiffness.  Skin: Negative for pallor.  Neurological: Negative for dizziness, speech difficulty, weakness and headaches.  Psychiatric/Behavioral: Negative for confusion.    Physical Exam Updated Vital Signs BP (!) 160/115   Pulse 69   Temp 98.7 F (37.1 C) (Oral)   Resp 20   LMP 09/25/2020   SpO2 93%   Physical Exam Constitutional:      General: She is not in acute distress.    Appearance: Normal appearance. She is not ill-appearing, toxic-appearing or diaphoretic.  HENT:     Mouth/Throat:     Mouth: Mucous membranes are dry.     Pharynx: Oropharynx is clear.  Eyes:     General: No scleral icterus.    Extraocular Movements: Extraocular movements intact.     Pupils: Pupils are equal, round, and reactive to light.  Cardiovascular:     Rate and Rhythm: Normal rate and regular rhythm.     Pulses: Normal pulses.     Heart sounds: Normal heart sounds.  Pulmonary:     Effort: Pulmonary effort is normal. No respiratory distress.     Breath sounds: Normal breath sounds. No stridor. No wheezing, rhonchi or rales.  Chest:     Chest wall: No tenderness.  Abdominal:     General: Abdomen is flat. There is no distension.     Palpations: Abdomen is soft.     Tenderness: There is abdominal tenderness in the epigastric area. There is no guarding or rebound.       Comments: Discomfort epigastric area, no guarding.  Musculoskeletal:        General: No swelling or tenderness. Normal range of motion.     Cervical back: Normal range of motion and neck supple. No rigidity.     Right lower leg: No edema.     Left lower leg: No edema.  Skin:    General: Skin is warm and dry.     Capillary Refill: Capillary refill takes less than 2 seconds.     Coloration: Skin is not pale.  Neurological:     General: No focal deficit present.     Mental Status: She is alert and oriented to person, place, and time.  Psychiatric:        Mood  and Affect: Mood normal.        Behavior: Behavior normal.     ED Results / Procedures / Treatments   Labs (all labs ordered are listed, but only abnormal results are displayed) Labs Reviewed  COMPREHENSIVE METABOLIC PANEL - Abnormal; Notable for the following components:      Result Value   Sodium 133 (*)    Potassium 3.4 (*)    Creatinine, Ser 1.04 (*)    Calcium 8.8 (*)    AST 43 (*)  Total Bilirubin 1.5 (*)    All other components within normal limits  URINALYSIS, ROUTINE W REFLEX MICROSCOPIC - Abnormal; Notable for the following components:   Color, Urine AMBER (*)    APPearance HAZY (*)    Specific Gravity, Urine 1.042 (*)    Bilirubin Urine MODERATE (*)    Ketones, ur 20 (*)    Protein, ur 100 (*)    Bacteria, UA RARE (*)    All other components within normal limits  LIPASE, BLOOD  CBC  MAGNESIUM  I-STAT BETA HCG BLOOD, ED ([facility], [facility], [facility] ONLY)    EKG None  Radiology CT Abdomen Pelvis W Contrast  Result Date: 10/04/2020 CLINICAL DATA:  Epigastric pain, vomiting. EXAM: CT ABDOMEN AND PELVIS WITH CONTRAST TECHNIQUE: Multidetector CT imaging of the abdomen and pelvis was performed using the standard protocol following bolus administration of intravenous contrast. CONTRAST:  OMNIPAQUE IOHEXOL 300 MG/ML  SOLN COMPARISON:  None. FINDINGS: Lower chest: No acute abnormality. Hepatobiliary: Liver is diffusely low in density suggesting fatty infiltration. No focal mass or lesion within the liver. Gallbladder appears normal. No bile duct dilatation is seen. Pancreas: Unremarkable. No pancreatic ductal dilatation or surrounding inflammatory changes. Spleen: Normal in size without focal abnormality. Adrenals/Urinary Tract: Kidneys are unremarkable without mass, stone or hydronephrosis. No perinephric fluid. No ureteral or bladder calculi are identified. Bladder is unremarkable, partially decompressed. Stomach/Bowel: No dilated large or small bowel loops. Fairly extensive  diverticulosis throughout the descending and sigmoid colon but no focal inflammatory changes seen to suggest acute diverticulitis. No evidence of bowel wall inflammation elsewhere. Fluid is present within segments of the nondistended small bowel. Stomach is unremarkable, partially decompressed. Appendix is normal. Vascular/Lymphatic: No significant vascular findings are present. No enlarged abdominal or pelvic lymph nodes. Reproductive: Uterus and bilateral adnexa are unremarkable. Other: No free fluid or abscess collection. No free intraperitoneal air. Musculoskeletal: No acute osseous abnormality. 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 Umbilical abdominal wall hernia which contains fat only. IMPRESSION: 1. Fluid within segments of the nondistended small bowel. This can be a secondary sign of a mild enteritis of infectious or inflammatory nature. No bowel wall thickening or mesenteric inflammation to confirm an enteritis. No bowel obstruction. 2. Extensive colonic diverticulosis, but no evidence of acute diverticulitis. 3. Fatty infiltration of the liver. 4. 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 If any radiculopathic symptoms, would consider nonemergent lumbar spine MRI for further characterization. Electronically Signed   By: Bary Richard M.D.   On: 10/04/2020 10:24    Procedures Procedures   Medications Ordered in ED Medications  sodium chloride 0.9 % bolus 1,000 mL (1,000 mLs Intravenous New Bag/Given 10/04/20 0904)  ondansetron (ZOFRAN) injection 4 mg (4 mg Intravenous Given 10/04/20 0904)  alum & mag hydroxide-simeth (MAALOX/MYLANTA) 200-200-20 MG/5ML suspension 30 mL (30 mLs Oral Given 10/04/20 1024)    And  lidocaine (XYLOCAINE) 2 % viscous mouth solution 15 mL (15 mLs Oral Given 10/04/20 1024)  sodium chloride 0.9  % bolus 1,000 mL (0 mLs Intravenous Stopped 10/04/20 1225)  iohexol (OMNIPAQUE) 300 MG/ML solution 100 mL (100 mLs Intravenous Contrast Given 10/04/20 1004)    ED Course  I have reviewed the triage vital signs and the nursing notes.  Pertinent labs & imaging results that were available during my care of the patient were reviewed by me and considered in my medical  decision making (see chart for details).    MDM Rules/Calculators/A&P                          Nasya Bruss is a 35 y.o. female with past medical history of hypertension, OSA that presents emergency department today for nausea vomiting and abdominal pain for the past 5 days.  Patient has not been able to keep anything down, does look dehydrated.  Patient with mild epigastric pain, no surgical abdomen.  Differential to include dehydration due to viral gastroenteritis.  Will obtain basic blood work and CT abdomen pelvis at this time.  Initial interventions include IV fluids and IV Zofran.  Labs show no major electrolyte or derangements.  Urinalysis does show concerns for dehydration, no signs of UTI. CT abdomen pelvis shows mild enteritis.   Upon reevaluation patient states that she wants to be admitted for continuous feeling nauseous, patient has not vomited while being here for over 5 hours.  Did discuss with patient that there is no need for immediate admission at this time since patient has not been vomiting, is passing p.o.  However patient is adamant that she wants to be evaluated by internal medicine for observation overnight since she has been vomiting at home.  Dr. Madilyn Hook evaluated patient as well, do not think that patient needs admission, however upon patient's request I will call hospitalist.  Dr. Criselda Peaches, [facility] will accept patient.   The patient appears reasonably stabilized for admission considering the current resources, flow, and capabilities available in the ED at this time, and I doubt any other [facility] requiring  further screening and/or treatment in the ED prior to admission.  Final Clinical Impression(s) / ED Diagnoses Final diagnoses:  Intractable vomiting with nausea, unspecified vomiting type  Epigastric pain    Rx / DC Orders ED Discharge Orders    None       Farrel Gordon, PA-C 10/04/20 1522    Lorre Nick, MD 10/06/20 1119

## 2020-10-04 NOTE — Plan of Care (Signed)
  Problem: Activity: Goal: Risk for activity intolerance will decrease Outcome: Progressing   Problem: Pain Managment: Goal: General experience of comfort will improve Outcome: Progressing   Problem: Safety: Goal: Ability to remain free from injury will improve Outcome: Progressing   

## 2020-10-05 DIAGNOSIS — R112 Nausea with vomiting, unspecified: Secondary | ICD-10-CM

## 2020-10-05 LAB — CBC
HCT: 36.3 % (ref 36.0–46.0)
Hemoglobin: 12.7 g/dL (ref 12.0–15.0)
MCH: 29.1 pg (ref 26.0–34.0)
MCHC: 35 g/dL (ref 30.0–36.0)
MCV: 83.3 fL (ref 80.0–100.0)
Platelets: 248 10*3/uL (ref 150–400)
RBC: 4.36 MIL/uL (ref 3.87–5.11)
RDW: 14.4 % (ref 11.5–15.5)
WBC: 4.2 10*3/uL (ref 4.0–10.5)
nRBC: 0 % (ref 0.0–0.2)

## 2020-10-05 LAB — COMPREHENSIVE METABOLIC PANEL
ALT: 36 U/L (ref 0–44)
AST: 31 U/L (ref 15–41)
Albumin: 3.2 g/dL — ABNORMAL LOW (ref 3.5–5.0)
Alkaline Phosphatase: 45 U/L (ref 38–126)
Anion gap: 9 (ref 5–15)
BUN: 6 mg/dL (ref 6–20)
CO2: 22 mmol/L (ref 22–32)
Calcium: 8 mg/dL — ABNORMAL LOW (ref 8.9–10.3)
Chloride: 103 mmol/L (ref 98–111)
Creatinine, Ser: 0.86 mg/dL (ref 0.44–1.00)
GFR, Estimated: >60 mL/min (ref >60)
Glucose, Bld: 91 mg/dL (ref 70–99)
Potassium: 3.5 mmol/L (ref 3.5–5.1)
Sodium: 134 mmol/L — ABNORMAL LOW (ref 135–145)
Total Bilirubin: 0.8 mg/dL (ref 0.3–1.2)
Total Protein: 6.2 g/dL — ABNORMAL LOW (ref 6.5–8.1)

## 2020-10-05 LAB — HEPATITIS A ANTIBODY, IGM: Hep A IgM: NONREACTIVE

## 2020-10-05 LAB — HEPATITIS A ANTIBODY, TOTAL: hep A Total Ab: REACTIVE — AB

## 2020-10-05 LAB — HIV ANTIBODY (ROUTINE TESTING W REFLEX): HIV Screen 4th Generation wRfx: NONREACTIVE

## 2020-10-05 MED ORDER — SODIUM CHLORIDE 0.9 % IV SOLN: INTRAVENOUS | Status: DC

## 2020-10-05 MED ORDER — DEXTROMETHORPHAN POLISTIREX ER 30 MG/5ML PO SUER
15.0000 mg | Freq: Two times a day (BID) | ORAL | Status: DC | PRN
  Administered 2020-10-05: 15 mg via ORAL
  Filled 2020-10-05 (×3): qty 5

## 2020-10-05 NOTE — Progress Notes (Signed)
Notified Dr Loney Loh that  pt states she wants a stronger cough medicine. Is having diarrhea 2x since 11am and 3x in the last 15 minutes. RN will continue to monitor.

## 2020-10-05 NOTE — Progress Notes (Signed)
PROGRESS NOTE    Michelle Morse  LNL:[id number] DOB: 1985/06/23 DOA: 10/04/2020 PCP: Courtney Paris, NP   Chief Complain: Intractable vomiting, diarrhea  Brief Narrative: Patient is a 35 year old female with history of hypertension, insulin resistance, obesity who presented with complaint of intractable nausea and vomiting.  On presentation, she was found to have mild AKI, hypokalemia, mild elevated liver enzymes.  Started on IV fluids.  CT abdomen/pelvis showed mild enteritis.  Overall symptoms have been improving.  Assessment & Plan:   Active Problems:   Intractable vomiting   Essential hypertension   Insulin resistance   Hidradenitis suppurativa   Intractable nausea/vomiting: CT abdomen showed mild enteritis.  Currently she does not have diarrhea.  Abdomen is soft and nontender and she does not complain of any abdominal pain. Continue supportive care, gentle IV fluids.  No need of antibiotic therapy. Hepatitis A antibody is positive but antigen is negative.  Mild AKI: Resolved  Electrolyte abnormalities: Continue monitoring supplementation  Hypertension: Currently blood pressure stable.  Continue current medications  Insulin resistance: Follows with her PCP.  On Ozempic subcu and phentermine.  History of hidradenitis suppurativa: No current outbreak.  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  Patient currently does not have any signs of radiculopathy.  We recommend follow-up with neurosurgery as an outpatient.           DVT prophylaxis:Lovenox Code Status: Full Family Communication:Mom at bedside Status is: Observation  Dispo: The patient is from: Home              Anticipated d/c is to: Home              Patient currently is not medically stable to d/c.   Difficult to place patient No     Consultants:  None  Procedures:None  Antimicrobials:  Anti-infectives (From admission, onward)   None      Subjective: Patient seen and examined at bedside this morning.  Hemodynamically stable.  Her nausea and vomiting is much better.  She has not vomited this morning, denies any abdominal pain or diarrhea.  Objective: Vitals:   10/04/20 1642 10/04/20 2139 10/05/20 0512 10/05/20 0846  BP: (!) 131/105 125/73 130/84 (!) 145/77  Pulse: 77 80 79 83  Resp: 16 18 17 17   Temp: 98.3 F (36.8 C) 98.2 F (36.8 C) 99.2 F (37.3 C) 98.6 F (37 C)  TempSrc: Oral Oral Oral Oral  SpO2:  96% 98% 99%  Weight: 128 kg     Height: 5\' 5"  (1.651 m)       Intake/Output Summary (Last 24 hours) at 10/05/2020 1123 Last data filed at 10/05/2020 0700 Gross per 24 hour  Intake 2383.29 ml  Output --  Net 2383.29 ml   Filed Weights   10/04/20 1642  Weight: 128 kg    Examination:  General exam: Overall comfortable, not in distress,morbidly obese HEENT: PERRL Respiratory system:  no wheezes or crackles  Cardiovascular system: S1 & S2 heard, RRR.  Gastrointestinal system: Abdomen is nondistended, soft and nontender. Central nervous system: Alert and oriented Extremities: No edema, no clubbing ,no cyanosis Skin: No rashes, no ulcers,no icterus      Data Reviewed: I have personally reviewed following labs and imaging studies  CBC: Recent Labs  Lab 10/01/20 1748 10/04/20 0756 10/04/20 2055 10/05/20 0404  WBC 11.9* 6.5 5.4 4.2  NEUTROABS 9.5*  --   --   --  HGB 13.8 14.9 13.4 12.7  HCT 41.0 43.7 38.9 36.3  MCV 85.6 85.5 82.8 83.3  PLT 369 307 285 248   Basic Metabolic Panel: Recent Labs  Lab 10/01/20 1748 10/04/20 0756 10/04/20 2055 10/05/20 0404  NA 138 133*  --  134*  K 3.1* 3.4*  --  3.5  CL 104 98  --  103  CO2 23 22  --  22  GLUCOSE 122* 96  --  91  BUN 7 13  --  6  CREATININE 0.85 1.04* 0.93 0.86  CALCIUM 9.4 8.8*  --  8.0*  MG  --  2.1  --   --    GFR: Estimated Creatinine  Clearance: 123.1 mL/min (by C-G formula based on SCr of 0.86 mg/dL). Liver Function Tests: Recent Labs  Lab 10/01/20 1748 10/04/20 0756 10/05/20 0404  AST 41 43* 31  ALT 24 39 36  ALKPHOS 59 57 45  BILITOT 0.8 1.5* 0.8  PROT 8.2* 7.6 6.2*  ALBUMIN 4.3 3.9 3.2*   Recent Labs  Lab 10/01/20 1748 10/04/20 0756  LIPASE 28 46   No results for input(s): AMMONIA in the last 168 hours. Coagulation Profile: No results for input(s): INR, PROTIME in the last 168 hours. Cardiac Enzymes: No results for input(s): CKTOTAL, CKMB, CKMBINDEX, TROPONINI in the last 168 hours. BNP (last 3 results) No results for input(s): PROBNP in the last 8760 hours. HbA1C: No results for input(s): HGBA1C in the last 72 hours. CBG: No results for input(s): GLUCAP in the last 168 hours. Lipid Profile: No results for input(s): CHOL, HDL, LDLCALC, TRIG, CHOLHDL, LDLDIRECT in the last 72 hours. Thyroid Function Tests: No results for input(s): TSH, T4TOTAL, FREET4, T3FREE, THYROIDAB in the last 72 hours. Anemia Panel: No results for input(s): VITAMINB12, FOLATE, FERRITIN, TIBC, IRON, RETICCTPCT in the last 72 hours. Sepsis Labs: No results for input(s): PROCALCITON, LATICACIDVEN in the last 168 hours.  Recent Results (from the past 240 hour(s))  SARS CORONAVIRUS 2 (TAT 6-24 HRS) Nasopharyngeal Nasopharyngeal Swab     Status: None   Collection Time: 10/04/20  2:47 PM   Specimen: Nasopharyngeal Swab  Result Value Ref Range Status   SARS Coronavirus 2 NEGATIVE NEGATIVE Final    Comment: (NOTE) SARS-CoV-2 target nucleic acids are NOT DETECTED.  The SARS-CoV-2 RNA is generally detectable in upper and lower respiratory specimens during the acute phase of infection. Negative results do not preclude SARS-CoV-2 infection, do not rule out co-infections with other pathogens, and should not be used as the sole basis for treatment or other patient management decisions. Negative results must be combined with clinical  observations, patient history, and epidemiological information. The expected result is Negative.  Fact Sheet for Patients: HairSlick.no  Fact Sheet for Healthcare Providers: quierodirigir.com  This test is not yet approved or cleared by the [place] FDA and  has been authorized for detection and/or diagnosis of SARS-CoV-2 by FDA under an Emergency Use Authorization (EUA). This EUA will remain  in effect (meaning this test can be used) for the duration of the COVID-19 declaration under Se ction 564(b)(1) of the Act, 21 U.S.C. section 360bbb-3(b)(1), unless the authorization is terminated or revoked sooner.  Performed at [facility], [address], [place], [place] [address]          Radiology Studies: CT Abdomen Pelvis W Contrast  Result Date: 10/04/2020 CLINICAL DATA:  Epigastric pain, vomiting. EXAM: CT ABDOMEN AND PELVIS WITH CONTRAST TECHNIQUE: Multidetector CT imaging of the abdomen  and pelvis was performed using the standard protocol following bolus administration of intravenous contrast. CONTRAST:  OMNIPAQUE IOHEXOL 300 MG/ML  SOLN COMPARISON:  None. FINDINGS: Lower chest: No acute abnormality. Hepatobiliary: Liver is diffusely low in density suggesting fatty infiltration. No focal mass or lesion within the liver. Gallbladder appears normal. No bile duct dilatation is seen. Pancreas: Unremarkable. No pancreatic ductal dilatation or surrounding inflammatory changes. Spleen: Normal in size without focal abnormality. Adrenals/Urinary Tract: Kidneys are unremarkable without mass, stone or hydronephrosis. No perinephric fluid. No ureteral or bladder calculi are identified. Bladder is unremarkable, partially decompressed. Stomach/Bowel: No dilated large or small bowel loops. Fairly extensive diverticulosis throughout the descending and sigmoid colon but no focal inflammatory changes seen to suggest acute  diverticulitis. No evidence of bowel wall inflammation elsewhere. Fluid is present within segments of the nondistended small bowel. Stomach is unremarkable, partially decompressed. Appendix is normal. Vascular/Lymphatic: No significant vascular findings are present. No enlarged abdominal or pelvic lymph nodes. Reproductive: Uterus and bilateral adnexa are unremarkable. Other: No free fluid or abscess collection. No free intraperitoneal air. Musculoskeletal: No acute osseous abnormality. 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 Umbilical abdominal wall hernia which contains fat only. IMPRESSION: 1. Fluid within segments of the nondistended small bowel. This can be a secondary sign of a mild enteritis of infectious or inflammatory nature. No bowel wall thickening or mesenteric inflammation to confirm an enteritis. No bowel obstruction. 2. Extensive colonic diverticulosis, but no evidence of acute diverticulitis. 3. Fatty infiltration of the liver. 4. 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 If any radiculopathic symptoms, would consider nonemergent lumbar spine MRI for further characterization. Electronically Signed   By: Bary Richard M.D.   On: 10/04/2020 10:24        Scheduled Meds: . enoxaparin (LOVENOX) injection  60 mg Subcutaneous Q24H  . hydrochlorothiazide  12.5 mg Oral Daily  . sodium chloride flush  3 mL Intravenous Q12H   Continuous Infusions:   LOS: 0 days    Time spent: 35 mins.More than 50% of that time was spent in counseling and/or coordination of care.      Burnadette Pop, MD [facility] P4/18/2022, 11:23 AM

## 2020-10-05 NOTE — Plan of Care (Signed)

## 2020-10-05 NOTE — Progress Notes (Signed)
[facility] informed that patient requested sleep aid, ice chips and cough medication. Asked if NPO order could be modified to include oral medications with sips. New orders received. Ilean Skill LPN

## 2020-10-06 ENCOUNTER — Observation Stay (HOSPITAL_COMMUNITY): Payer: 59

## 2020-10-06 DIAGNOSIS — R112 Nausea with vomiting, unspecified: Secondary | ICD-10-CM | POA: Diagnosis not present

## 2020-10-06 LAB — GASTROINTESTINAL PANEL BY PCR, STOOL (REPLACES STOOL CULTURE)

## 2020-10-06 LAB — C DIFFICILE QUICK SCREEN W PCR REFLEX
C Diff antigen: NEGATIVE
C Diff interpretation: NOT DETECTED
C Diff toxin: NEGATIVE

## 2020-10-06 MED ORDER — GUAIFENESIN ER 600 MG PO TB12
1200.0000 mg | ORAL_TABLET | Freq: Two times a day (BID) | ORAL | Status: DC
  Administered 2020-10-06 – 2020-10-07 (×3): 1200 mg via ORAL
  Filled 2020-10-06 (×3): qty 2

## 2020-10-06 MED ORDER — DIPHENOXYLATE-ATROPINE 2.5-0.025 MG PO TABS: 1.0000 | ORAL_TABLET | Freq: Four times a day (QID) | ORAL | Status: DC | PRN

## 2020-10-06 NOTE — Progress Notes (Signed)
PROGRESS NOTE    Michelle Morse  ESP:[id number] DOB: [birth year] DOA: 10/04/2020 PCP: Courtney Paris, NP   Chief Complain: Intractable vomiting, diarrhea  Brief Narrative: Patient is a 35 year old female with history of hypertension, insulin resistance, obesity who presented with complaint of intractable nausea and vomiting.  On presentation, she was found to have mild AKI, hypokalemia, mild elevated liver enzymes.  Started on IV fluids.  CT abdomen/pelvis showed mild enteritis.  Overall symptoms have been improving but diarrhoea has persisted.  Plan is to follow-up on GI pathogen panel, continuing IV fluids  Assessment & Plan:   Active Problems:   Intractable vomiting   Essential hypertension   Insulin resistance   Hidradenitis suppurativa   Intractable nausea/vomiting: CT abdomen showed mild enteritis. Abdomen is soft and nontender and she does not complain of any abdominal pain. Continue supportive care, gentle IV fluids.  No need of antibiotic therapy. Hepatitis A antibody is positive but antigen is negative. Nausea and vomiting has significantly improved  Diarrhea: Main problem now.  C. difficile negative.  Will check GI pathogen panel.  Continue gentle IV fluids.  Abdominal CT had showed diffuse diverticulitis but no diverticulitis  Mild AKI: Resolved  Electrolyte abnormalities: Continue monitoring supplementation  Hypertension: Currently blood pressure stable.  Continue current medications  Insulin resistance: Follows with her PCP.  On Ozempic subcu and phentermine.  We will recommend to hold this medications  History of hidradenitis suppurativa: No current outbreak.  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  Patient currently does not have any signs of radiculopathy.  We recommend follow-up with neurosurgery as an  outpatient.  Productive cough: chest x-ray did not show pneumonia or fluid.  We will try Mucinex           DVT prophylaxis:Lovenox Code Status: Full Family Communication:Mom at bedside on 10/05/20 Status is: Observation  Dispo: The patient is from: Home              Anticipated d/c is to: Home              Patient currently is not medically stable to d/c.   Difficult to place patient No     Consultants: None  Procedures:None  Antimicrobials:  Anti-infectives (From admission, onward)   None      Subjective: Patient seen and examined the bedside this morning.  She was having diarrhea when I entered the room.  Her nausea and vomiting have improved.  She feels overall better but being bothered with diarrhea and cough.  Does not feel ready to go home.  Objective: Vitals:   10/05/20 0846 10/05/20 1442 10/05/20 2008 10/06/20 0502  BP: (!) 145/77 (!) 141/94 130/82 (!) 145/99  Pulse: 83 94 92 76  Resp: 17 14 20 20   Temp: 98.6 F (37 C) 98.4 F (36.9 C) 98.5 F (36.9 C) 98.2 F (36.8 C)  TempSrc: Oral Oral Oral Oral  SpO2: 99% 100% 97% 100%  Weight:      Height:        Intake/Output Summary (Last 24 hours) at 10/06/2020 1108 Last data filed at 10/05/2020 1500 Gross per 24 hour  Intake 178.52 ml  Output --  Net 178.52 ml   Filed Weights   10/04/20 1642  Weight: 128 kg    Examination:  General exam: Overall comfortable, not in distress, morbidly obese HEENT: PERRL Respiratory system:  no wheezes or crackles  Cardiovascular system: S1 & S2 heard, RRR.  Gastrointestinal system: Abdomen is nondistended, soft and nontender. Central nervous system: Alert and oriented Extremities: No edema, no clubbing ,no cyanosis Skin: No rashes, no ulcers,no icterus       Data Reviewed: I have personally reviewed following labs and imaging studies  CBC: Recent Labs  Lab 10/01/20 1748 10/04/20 0756 10/04/20 2055 10/05/20 0404  WBC 11.9* 6.5 5.4 4.2  NEUTROABS 9.5*   --   --   --   HGB 13.8 14.9 13.4 12.7  HCT 41.0 43.7 38.9 36.3  MCV 85.6 85.5 82.8 83.3  PLT 369 307 285 248   Basic Metabolic Panel: Recent Labs  Lab 10/01/20 1748 10/04/20 0756 10/04/20 2055 10/05/20 0404  NA 138 133*  --  134*  K 3.1* 3.4*  --  3.5  CL 104 98  --  103  CO2 23 22  --  22  GLUCOSE 122* 96  --  91  BUN 7 13  --  6  CREATININE 0.85 1.04* 0.93 0.86  CALCIUM 9.4 8.8*  --  8.0*  MG  --  2.1  --   --    GFR: Estimated Creatinine Clearance: 123.1 mL/min (by C-G formula based on SCr of 0.86 mg/dL). Liver Function Tests: Recent Labs  Lab 10/01/20 1748 10/04/20 0756 10/05/20 0404  AST 41 43* 31  ALT 24 39 36  ALKPHOS 59 57 45  BILITOT 0.8 1.5* 0.8  PROT 8.2* 7.6 6.2*  ALBUMIN 4.3 3.9 3.2*   Recent Labs  Lab 10/01/20 1748 10/04/20 0756  LIPASE 28 46   No results for input(s): AMMONIA in the last 168 hours. Coagulation Profile: No results for input(s): INR, PROTIME in the last 168 hours. Cardiac Enzymes: No results for input(s): CKTOTAL, CKMB, CKMBINDEX, TROPONINI in the last 168 hours. BNP (last 3 results) No results for input(s): PROBNP in the last 8760 hours. HbA1C: No results for input(s): HGBA1C in the last 72 hours. CBG: No results for input(s): GLUCAP in the last 168 hours. Lipid Profile: No results for input(s): CHOL, HDL, LDLCALC, TRIG, CHOLHDL, LDLDIRECT in the last 72 hours. Thyroid Function Tests: No results for input(s): TSH, T4TOTAL, FREET4, T3FREE, THYROIDAB in the last 72 hours. Anemia Panel: No results for input(s): VITAMINB12, FOLATE, FERRITIN, TIBC, IRON, RETICCTPCT in the last 72 hours. Sepsis Labs: No results for input(s): PROCALCITON, LATICACIDVEN in the last 168 hours.  Recent Results (from the past 240 hour(s))  SARS CORONAVIRUS 2 (TAT 6-24 HRS) Nasopharyngeal Nasopharyngeal Swab     Status: None   Collection Time: 10/04/20  2:47 PM   Specimen: Nasopharyngeal Swab  Result Value Ref Range Status   SARS Coronavirus 2  NEGATIVE NEGATIVE Final    Comment: (NOTE) SARS-CoV-2 target nucleic acids are NOT DETECTED.  The SARS-CoV-2 RNA is generally detectable in upper and lower respiratory specimens during the acute phase of infection. Negative results do not preclude SARS-CoV-2 infection, do not rule out co-infections with other pathogens, and should not be used as the sole basis for treatment or other patient management decisions. Negative results must be combined with clinical observations, patient history, and epidemiological information. The expected result is Negative.  Fact Sheet for Patients: HairSlick.no  Fact Sheet for Healthcare Providers: quierodirigir.com  This test is not yet approved or cleared by the [place] FDA and  has been authorized for detection and/or diagnosis of SARS-CoV-2 by FDA under an Emergency Use Authorization (EUA). This EUA will remain  in effect (meaning  this test can be used) for the duration of the COVID-19 declaration under Se ction 564(b)(1) of the Act, 21 U.S.C. section 360bbb-3(b)(1), unless the authorization is terminated or revoked sooner.  Performed at [facility], [address], [place], [place] [address]   C Difficile Quick Screen w PCR reflex     Status: None   Collection Time: 10/06/20  2:22 AM   Specimen: STOOL  Result Value Ref Range Status   C Diff antigen NEGATIVE NEGATIVE Final   C Diff toxin NEGATIVE NEGATIVE Final   C Diff interpretation No C. difficile detected.  Final    Comment: Performed at [facility], [address], [place], [place] [address]         Radiology Studies: DG CHEST PORT 1 VIEW  Result Date: 10/06/2020 CLINICAL DATA:  Shortness of breath EXAM: PORTABLE CHEST 1 VIEW COMPARISON:  None. FINDINGS: Low volume chest. There is no edema, consolidation, effusion, or pneumothorax. Normal heart size and mediastinal contours for technique. No osseous  findings. IMPRESSION: No evidence of active disease. Electronically Signed   By: Marnee Spring M.D.   On: 10/06/2020 10:37        Scheduled Meds: . enoxaparin (LOVENOX) injection  60 mg Subcutaneous Q24H  . guaiFENesin  1,200 mg Oral BID  . hydrochlorothiazide  12.5 mg Oral Daily  . sodium chloride flush  3 mL Intravenous Q12H   Continuous Infusions: . sodium chloride 75 mL/hr at 10/06/20 0258     LOS: 0 days    Time spent: 35 mins.More than 50% of that time was spent in counseling and/or coordination of care.      Burnadette Pop, MD [facility] P4/19/2022, 11:08 AM

## 2020-10-06 NOTE — Plan of Care (Signed)
  Problem: Activity: Goal: Risk for activity intolerance will decrease Outcome: Progressing   Problem: Coping: Goal: Level of anxiety will decrease Outcome: Progressing   Problem: Pain Managment: Goal: General experience of comfort will improve Outcome: Progressing   Problem: Safety: Goal: Ability to remain free from injury will improve Outcome: Progressing   Problem: Skin Integrity: Goal: Risk for impaired skin integrity will decrease Outcome: Progressing   

## 2020-10-07 DIAGNOSIS — R112 Nausea with vomiting, unspecified: Secondary | ICD-10-CM | POA: Diagnosis not present

## 2020-10-07 LAB — BASIC METABOLIC PANEL
Anion gap: 6 (ref 5–15)
BUN: 6 mg/dL (ref 6–20)
CO2: 24 mmol/L (ref 22–32)
Calcium: 7.8 mg/dL — ABNORMAL LOW (ref 8.9–10.3)
Chloride: 105 mmol/L (ref 98–111)
Creatinine, Ser: 0.81 mg/dL (ref 0.44–1.00)
GFR, Estimated: >60 mL/min (ref >60)
Glucose, Bld: 112 mg/dL — ABNORMAL HIGH (ref 70–99)
Potassium: 3.2 mmol/L — ABNORMAL LOW (ref 3.5–5.1)
Sodium: 135 mmol/L (ref 135–145)

## 2020-10-07 MED ORDER — DIPHENOXYLATE-ATROPINE 2.5-0.025 MG PO TABS: 1.0000 | ORAL_TABLET | Freq: Four times a day (QID) | ORAL | 0 refills | Status: DC | PRN

## 2020-10-07 MED ORDER — GUAIFENESIN ER 600 MG PO TB12: 600.0000 mg | ORAL_TABLET | Freq: Two times a day (BID) | ORAL | 0 refills | Status: AC | PRN

## 2020-10-07 MED ORDER — PROMETHAZINE HCL 25 MG RE SUPP: 25.0000 mg | Freq: Three times a day (TID) | RECTAL | 0 refills | Status: DC | PRN

## 2020-10-07 MED ORDER — POTASSIUM CHLORIDE ER 20 MEQ PO TBCR: 40.0000 meq | EXTENDED_RELEASE_TABLET | Freq: Every day | ORAL | 0 refills | Status: DC

## 2020-10-07 MED ORDER — POTASSIUM CHLORIDE CRYS ER 20 MEQ PO TBCR
40.0000 meq | EXTENDED_RELEASE_TABLET | Freq: Once | ORAL | Status: AC
  Administered 2020-10-07: 40 meq via ORAL
  Filled 2020-10-07: qty 2

## 2020-10-07 NOTE — Discharge Summary (Signed)
Physician Discharge Summary  Michelle Morse ZOX:[id number] DOB: [birth year] DOA: 10/04/2020  PCP: Courtney ParisMcCoy, Rachel, NP  Admit date: 10/04/2020 Discharge date: 10/07/2020  Admitted From: Home Disposition:  Home  Discharge Condition:Stable CODE STATUS:FULL Diet recommendation: Regular  Brief/Interim Summary:  Patient is a 35 year old female with history of hypertension, insulin resistance, obesity who presented with complaint of intractable nausea and vomiting.  On presentation, she was found to have mild AKI, hypokalemia, mild elevated liver enzymes.  Started on IV fluids.  CT abdomen/pelvis showed mild enteritis.  Overall symptoms have been improving .  Nausea, vomiting, diarrhea have improved.  C. difficile, GI pathogen panel negative.  She is medically stable for discharge home today.  Following problems were addressed during her hospitalization:  Intractable nausea/vomiting: CT abdomen showed mild enteritis. Abdomen is soft and nontender and she does not complain of any abdominal pain. Hepatitis A antibody is positive but antigen is negative. Nausea and vomiting has significantly improved  Diarrhea:   Abdominal CT had showed diffuse diverticulitis but no diverticulitis.  Negative C. difficile, GI pathogen panel.  If she continues to have diarrhea in the future, she needs to follow-up with GI as an outpatient for elective colonoscopy  Mild AKI: Resolved  Electrolyte abnormalities: Continue potassium supplementation  Hypertension: Currently blood pressure stable.  Continue current medication  Insulin resistance: Follows with her PCP.  On Ozempic subcu and phentermine.  We will recommend to phenteramine  History of hidradenitis suppurativa: No current outbreak.  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  Patient currently does not have any  signs of radiculopathy.  We recommend follow-up with neurosurgery as an outpatient.  Productive cough: chest x-ray did not show pneumonia or fluid.  On Mucinex  Discharge Diagnoses:  Active Problems:   Intractable vomiting   Essential hypertension   Insulin resistance   Hidradenitis suppurativa    Discharge Instructions  Discharge Instructions    Diet general   Complete by: As directed    Discharge instructions   Complete by: As directed    1)Please take prescribed medications as instructed. 2)Follow up with your PCP in 1-2 week. 3)We recommend to follow-up with neurosurgery as an outpatient nonurgently  Name and number of the provider has been attached.  Please call for appointment.   Increase activity slowly   Complete by: As directed      Allergies as of 10/07/2020   No Known Allergies     Medication List    STOP taking these medications   doxycycline 100 MG capsule Commonly known as: VIBRAMYCIN   phentermine 37.5 MG tablet Commonly known as: ADIPEX-P     TAKE these medications   acetaminophen 500 MG tablet Commonly known as: TYLENOL Take 1,000 mg by mouth every 6 (six) hours as needed (severe headache).   aspirin-acetaminophen-caffeine 250-250-65 MG tablet Commonly known as: EXCEDRIN MIGRAINE Take 2 tablets by mouth every 6 (six) hours as needed for headache or migraine.   Clobetasol Propionate 0.05 % external spray Commonly known as: TEMOVATE Apply 1 application topically daily as needed (scalp itching/ dermatitis).   diphenoxylate-atropine 2.5-0.025 MG tablet Commonly known as: LOMOTIL Take 1 tablet by mouth 4 (four) times daily as needed for diarrhea or loose stools.   guaiFENesin 600 MG 12 hr tablet Commonly known as: MUCINEX Take 1 tablet (600 mg total) by mouth 2 (two) times daily as needed for up to 7 days.  hydrochlorothiazide 25 MG tablet Commonly known as: HYDRODIURIL Take 25 mg by mouth every morning. What changed: Another medication  with the same name was removed. Continue taking this medication, and follow the directions you see here.   norethindrone 0.35 MG tablet Commonly known as: MICRONOR Take 1 tablet by mouth daily with supper.   ondansetron 4 MG disintegrating tablet Commonly known as: Zofran ODT Take 1 tablet (4 mg total) by mouth every 8 (eight) hours as needed for nausea or vomiting.   Ozempic (1 MG/DOSE) 4 MG/3ML Sopn Generic drug: Semaglutide (1 MG/DOSE) Inject 1 mg into the skin every Saturday.   Potassium Chloride ER 20 MEQ Tbcr Take 40 mEq by mouth daily for 5 days. What changed:   medication strength  how much to take  when to take this   promethazine 25 MG suppository Commonly known as: PHENERGAN Place 1 suppository (25 mg total) rectally every 8 (eight) hours as needed for nausea or vomiting.   Vitamin D (Ergocalciferol) 1.25 MG (50000 UNIT) Caps capsule Commonly known as: DRISDOL Take 50,000 Units by mouth every Monday.   ZZZQUIL PO Take 1 capsule by mouth at bedtime as needed (sleep).       Follow-up Information    Courtney Paris, NP. Schedule an appointment as soon as possible for a visit in 1 week(s).   Specialty: Nurse Practitioner Contact information: [address] [place] [address] [phone]        Julio Sicks, MD. Schedule an appointment as soon as possible for a visit in 4 week(s).   Specialty: Neurosurgery Contact information: [address] [place] [address] [phone]              No Known Allergies  Consultations:  None   Procedures/Studies: CT Abdomen Pelvis W Contrast  Result Date: 10/04/2020 CLINICAL DATA:  Epigastric pain, vomiting. EXAM: CT ABDOMEN AND PELVIS WITH CONTRAST TECHNIQUE: Multidetector CT imaging of the abdomen and pelvis was performed using the standard protocol following bolus administration of intravenous contrast. CONTRAST:  OMNIPAQUE IOHEXOL 300 MG/ML  SOLN COMPARISON:  None. FINDINGS:  Lower chest: No acute abnormality. Hepatobiliary: Liver is diffusely low in density suggesting fatty infiltration. No focal mass or lesion within the liver. Gallbladder appears normal. No bile duct dilatation is seen. Pancreas: Unremarkable. No pancreatic ductal dilatation or surrounding inflammatory changes. Spleen: Normal in size without focal abnormality. Adrenals/Urinary Tract: Kidneys are unremarkable without mass, stone or hydronephrosis. No perinephric fluid. No ureteral or bladder calculi are identified. Bladder is unremarkable, partially decompressed. Stomach/Bowel: No dilated large or small bowel loops. Fairly extensive diverticulosis throughout the descending and sigmoid colon but no focal inflammatory changes seen to suggest acute diverticulitis. No evidence of bowel wall inflammation elsewhere. Fluid is present within segments of the nondistended small bowel. Stomach is unremarkable, partially decompressed. Appendix is normal. Vascular/Lymphatic: No significant vascular findings are present. No enlarged abdominal or pelvic lymph nodes. Reproductive: Uterus and bilateral adnexa are unremarkable. Other: No free fluid or abscess collection. No free intraperitoneal air. Musculoskeletal: No acute osseous abnormality. 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 Umbilical abdominal wall hernia which contains fat only. IMPRESSION: 1. Fluid within segments of the nondistended small bowel. This can be a secondary sign of a mild enteritis of infectious or inflammatory nature. No bowel wall thickening or mesenteric inflammation to confirm an enteritis. No bowel obstruction. 2. Extensive colonic diverticulosis, but no evidence of acute diverticulitis. 3.  Fatty infiltration of the liver. 4. 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 If any  radiculopathic symptoms, would consider nonemergent lumbar spine MRI for further characterization. Electronically Signed   By: Bary Richard M.D.   On: 10/04/2020 10:24   DG CHEST PORT 1 VIEW  Result Date: 10/06/2020 CLINICAL DATA:  Shortness of breath EXAM: PORTABLE CHEST 1 VIEW COMPARISON:  None. FINDINGS: Low volume chest. There is no edema, consolidation, effusion, or pneumothorax. Normal heart size and mediastinal contours for technique. No osseous findings. IMPRESSION: No evidence of active disease. Electronically Signed   By: Marnee Spring M.D.   On: 10/06/2020 10:37       Subjective:   DischargePatient seen and examined the bedside this morning.  Hemodynamically stable for discharge.   Exam: Vitals:   10/06/20 1953 10/07/20 0753  BP: (!) 132/93 (!) 136/95  Pulse: 80 69  Resp:  16  Temp: 98.3 F (36.8 C) 98.6 F (37 C)  SpO2: 98% 99%   Vitals:   10/06/20 0502 10/06/20 1547 10/06/20 1953 10/07/20 0753  BP: (!) 145/99 124/90 (!) 132/93 (!) 136/95  Pulse: 76 79 80 69  Resp: Temp: 98.2 F (36.8 C) 98.1 F (36.7 C) 98.3 F (36.8 C) 98.6 F (37 C)  TempSrc: Oral Oral Oral Oral  SpO2: 100% 98% 98% 99%  Weight:      Height:        General: Pt is alert, awake, not in acute distress Cardiovascular: RRR, S1/S2 +, no rubs, no gallops Respiratory: CTA bilaterally, no wheezing, no rhonchi Abdominal: Soft, NT, ND, bowel sounds + Extremities: no edema, no cyanosis    The results of significant diagnostics from this hospitalization (including imaging, microbiology, ancillary and laboratory) are listed below for reference.     Microbiology: Recent Results (from the past 240 hour(s))  SARS CORONAVIRUS 2 (TAT 6-24 HRS) Nasopharyngeal Nasopharyngeal Swab     Status: None   Collection Time: 10/04/20  2:47 PM   Specimen: Nasopharyngeal Swab  Result Value Ref Range Status   SARS Coronavirus 2 NEGATIVE NEGATIVE Final    Comment: (NOTE) SARS-CoV-2 target nucleic  acids are NOT DETECTED.  The SARS-CoV-2 RNA is generally detectable in upper and lower respiratory specimens during the acute phase of infection. Negative results do not preclude SARS-CoV-2 infection, do not rule out co-infections with other pathogens, and should not be used as the sole basis for treatment or other patient management decisions. Negative results must be combined with clinical observations, patient history, and epidemiological information. The expected result is Negative.  Fact Sheet for Patients: HairSlick.no  Fact Sheet for Healthcare Providers: quierodirigir.com  This test is not yet approved or cleared by the [place] FDA and  has been authorized for detection and/or diagnosis of SARS-CoV-2 by FDA under an Emergency Use Authorization (EUA). This EUA will remain  in effect (meaning this test can be used) for the duration of the COVID-19 declaration under Se ction 564(b)(1) of the Act, 21 U.S.C. section 360bbb-3(b)(1), unless the authorization is terminated or revoked sooner.  Performed at [facility], [address] [place], [place] [address]   C Difficile Quick Screen w PCR reflex     Status: None   Collection Time: 10/06/20  2:22 AM   Specimen: STOOL  Result Value Ref Range Status   C Diff antigen NEGATIVE NEGATIVE Final   C Diff toxin NEGATIVE  NEGATIVE Final   C Diff interpretation No C. difficile detected.  Final    Comment: Performed at [facility], [address] [place], [place] [address]  Gastrointestinal Panel by PCR , Stool     Status: None   Collection Time: 10/06/20  2:22 AM   Specimen: STOOL  Result Value Ref Range Status   Campylobacter species NOT DETECTED NOT DETECTED Final   Plesimonas shigelloides NOT DETECTED NOT DETECTED Final   Salmonella species NOT DETECTED NOT DETECTED Final   Yersinia enterocolitica NOT DETECTED NOT DETECTED Final   Vibrio species NOT  DETECTED NOT DETECTED Final   Vibrio cholerae NOT DETECTED NOT DETECTED Final   Enteroaggregative E coli (EAEC) NOT DETECTED NOT DETECTED Final   Enteropathogenic E coli (EPEC) NOT DETECTED NOT DETECTED Final   Enterotoxigenic E coli (ETEC) NOT DETECTED NOT DETECTED Final   Shiga like toxin producing E coli (STEC) NOT DETECTED NOT DETECTED Final   Shigella/Enteroinvasive E coli (EIEC) NOT DETECTED NOT DETECTED Final   Cryptosporidium NOT DETECTED NOT DETECTED Final   Cyclospora cayetanensis NOT DETECTED NOT DETECTED Final   Entamoeba histolytica NOT DETECTED NOT DETECTED Final   Giardia lamblia NOT DETECTED NOT DETECTED Final   Adenovirus F40/41 NOT DETECTED NOT DETECTED Final   Astrovirus NOT DETECTED NOT DETECTED Final   Norovirus GI/GII NOT DETECTED NOT DETECTED Final   Rotavirus A NOT DETECTED NOT DETECTED Final   Sapovirus (I, II, IV, and V) NOT DETECTED NOT DETECTED Final    Comment: Performed at [facility], [address] [place], [place] [address]     Labs: BNP (last 3 results) No results for input(s): BNP in the last 8760 hours. Basic Metabolic Panel: Recent Labs  Lab 10/01/20 1748 10/04/20 0756 10/04/20 2055 10/05/20 0404 10/07/20 0308  NA 138 133*  --  134* 135  K 3.1* 3.4*  --  3.5 3.2*  CL 104 98  --  103 105  CO2 23 22  --  22 24  GLUCOSE 122* 96  --  91 112*  BUN 7 13  --  6 6  CREATININE 0.85 1.04* 0.93 0.86 0.81  CALCIUM 9.4 8.8*  --  8.0* 7.8*  MG  --  2.1  --   --   --    Liver Function Tests: Recent Labs  Lab 10/01/20 1748 10/04/20 0756 10/05/20 0404  AST 41 43* 31  ALT 24 39 36  ALKPHOS 59 57 45  BILITOT 0.8 1.5* 0.8  PROT 8.2* 7.6 6.2*  ALBUMIN 4.3 3.9 3.2*   Recent Labs  Lab 10/01/20 1748 10/04/20 0756  LIPASE 28 46   No results for input(s): AMMONIA in the last 168 hours. CBC: Recent Labs  Lab 10/01/20 1748 10/04/20 0756 10/04/20 2055 10/05/20 0404  WBC 11.9* 6.5 5.4 4.2  NEUTROABS 9.5*  --   --   --   HGB 13.8  14.9 13.4 12.7  HCT 41.0 43.7 38.9 36.3  MCV 85.6 85.5 82.8 83.3  PLT 369 307 285 248   Cardiac Enzymes: No results for input(s): CKTOTAL, CKMB, CKMBINDEX, TROPONINI in the last 168 hours. BNP: Invalid input(s): POCBNP CBG: No results for input(s): GLUCAP in the last 168 hours. D-Dimer No results for input(s): DDIMER in the last 72 hours. Hgb A1c No results for input(s): HGBA1C in the last 72 hours. Lipid Profile No results for input(s): CHOL, HDL, LDLCALC, TRIG, CHOLHDL, LDLDIRECT in the last 72 hours. Thyroid function studies No results for input(s):  TSH, T4TOTAL, T3FREE, THYROIDAB in the last 72 hours.  Invalid input(s): FREET3 Anemia work up No results for input(s): VITAMINB12, FOLATE, FERRITIN, TIBC, IRON, RETICCTPCT in the last 72 hours. Urinalysis    Component Value Date/Time   COLORURINE AMBER (A) 10/04/2020 0756   APPEARANCEUR HAZY (A) 10/04/2020 0756   LABSPEC 1.042 (H) 10/04/2020 0756   PHURINE 5.0 10/04/2020 0756   GLUCOSEU NEGATIVE 10/04/2020 0756   HGBUR NEGATIVE 10/04/2020 0756   BILIRUBINUR MODERATE (A) 10/04/2020 0756   KETONESUR 20 (A) 10/04/2020 0756   PROTEINUR 100 (A) 10/04/2020 0756   NITRITE NEGATIVE 10/04/2020 0756   LEUKOCYTESUR NEGATIVE 10/04/2020 0756   Sepsis Labs Invalid input(s): PROCALCITONIN,  WBC,  LACTICIDVEN Microbiology Recent Results (from the past 240 hour(s))  SARS CORONAVIRUS 2 (TAT 6-24 HRS) Nasopharyngeal Nasopharyngeal Swab     Status: None   Collection Time: 10/04/20  2:47 PM   Specimen: Nasopharyngeal Swab  Result Value Ref Range Status   SARS Coronavirus 2 NEGATIVE NEGATIVE Final    Comment: (NOTE) SARS-CoV-2 target nucleic acids are NOT DETECTED.  The SARS-CoV-2 RNA is generally detectable in upper and lower respiratory specimens during the acute phase of infection. Negative results do not preclude SARS-CoV-2 infection, do not rule out co-infections with other pathogens, and should not be used as the sole basis for  treatment or other patient management decisions. Negative results must be combined with clinical observations, patient history, and epidemiological information. The expected result is Negative.  Fact Sheet for Patients: HairSlick.no  Fact Sheet for Healthcare Providers: quierodirigir.com  This test is not yet approved or cleared by the [place] FDA and  has been authorized for detection and/or diagnosis of SARS-CoV-2 by FDA under an Emergency Use Authorization (EUA). This EUA will remain  in effect (meaning this test can be used) for the duration of the COVID-19 declaration under Se ction 564(b)(1) of the Act, 21 U.S.C. section 360bbb-3(b)(1), unless the authorization is terminated or revoked sooner.  Performed at [facility], [address] [place], [place] [address]   C Difficile Quick Screen w PCR reflex     Status: None   Collection Time: 10/06/20  2:22 AM   Specimen: STOOL  Result Value Ref Range Status   C Diff antigen NEGATIVE NEGATIVE Final   C Diff toxin NEGATIVE NEGATIVE Final   C Diff interpretation No C. difficile detected.  Final    Comment: Performed at [facility], [address], [place], [place] [address]  Gastrointestinal Panel by PCR , Stool     Status: None   Collection Time: 10/06/20  2:22 AM   Specimen: STOOL  Result Value Ref Range Status   Campylobacter species NOT DETECTED NOT DETECTED Final   Plesimonas shigelloides NOT DETECTED NOT DETECTED Final   Salmonella species NOT DETECTED NOT DETECTED Final   Yersinia enterocolitica NOT DETECTED NOT DETECTED Final   Vibrio species NOT DETECTED NOT DETECTED Final   Vibrio cholerae NOT DETECTED NOT DETECTED Final   Enteroaggregative E coli (EAEC) NOT DETECTED NOT DETECTED Final   Enteropathogenic E coli (EPEC) NOT DETECTED NOT DETECTED Final   Enterotoxigenic E coli (ETEC) NOT DETECTED NOT DETECTED Final   Shiga like toxin producing E  coli (STEC) NOT DETECTED NOT DETECTED Final   Shigella/Enteroinvasive E coli (EIEC) NOT DETECTED NOT DETECTED Final   Cryptosporidium NOT DETECTED NOT DETECTED Final   Cyclospora cayetanensis NOT DETECTED NOT DETECTED Final   Entamoeba histolytica NOT DETECTED NOT DETECTED Final  Giardia lamblia NOT DETECTED NOT DETECTED Final   Adenovirus F40/41 NOT DETECTED NOT DETECTED Final   Astrovirus NOT DETECTED NOT DETECTED Final   Norovirus GI/GII NOT DETECTED NOT DETECTED Final   Rotavirus A NOT DETECTED NOT DETECTED Final   Sapovirus (I, II, IV, and V) NOT DETECTED NOT DETECTED Final    Comment: Performed at [facility], [address] [place], [place] [address]    Please note: You were cared for by a hospitalist during your hospital stay. Once you are discharged, your primary care physician will handle any further medical issues. Please note that NO REFILLS for any discharge medications will be authorized once you are discharged, as it is imperative that you return to your primary care physician (or establish a relationship with a primary care physician if you do not have one) for your post hospital discharge needs so that they can reassess your need for medications and monitor your lab values.    Time coordinating discharge: 40 minutes  SIGNED:   Burnadette Pop, MD  [facility] 10/07/2020, 10:28 AM Pager [phone]  If 7PM-7AM, please contact night-coverage [email or web address] Password [email or web address]

## 2020-10-20 ENCOUNTER — Other Ambulatory Visit: Payer: Self-pay

## 2020-10-20 ENCOUNTER — Ambulatory Visit: Payer: 59 | Admitting: Podiatry

## 2020-10-20 ENCOUNTER — Other Ambulatory Visit: Payer: Self-pay | Admitting: Podiatry

## 2020-10-20 ENCOUNTER — Ambulatory Visit (INDEPENDENT_AMBULATORY_CARE_PROVIDER_SITE_OTHER): Payer: 59

## 2020-10-20 DIAGNOSIS — M79672 Pain in left foot: Secondary | ICD-10-CM | POA: Diagnosis not present

## 2020-10-20 DIAGNOSIS — B353 Tinea pedis: Secondary | ICD-10-CM | POA: Diagnosis not present

## 2020-10-20 DIAGNOSIS — M778 Other enthesopathies, not elsewhere classified: Secondary | ICD-10-CM

## 2020-10-20 DIAGNOSIS — M779 Enthesopathy, unspecified: Secondary | ICD-10-CM

## 2020-10-20 DIAGNOSIS — M7752 Other enthesopathy of left foot: Secondary | ICD-10-CM

## 2020-10-20 DIAGNOSIS — M205X2 Other deformities of toe(s) (acquired), left foot: Secondary | ICD-10-CM | POA: Diagnosis not present

## 2020-10-20 MED ORDER — MELOXICAM 15 MG PO TABS: 15.0000 mg | ORAL_TABLET | Freq: Every day | ORAL | 0 refills | Status: DC

## 2020-10-20 MED ORDER — KETOCONAZOLE 2 % EX CREA: 1.0000 "application " | TOPICAL_CREAM | Freq: Every day | CUTANEOUS | 0 refills | Status: DC

## 2020-10-20 MED ORDER — DEXAMETHASONE SODIUM PHOSPHATE 120 MG/30ML IJ SOLN
2.0000 mg | Freq: Once | INTRAMUSCULAR | Status: AC
  Administered 2020-10-20: 2 mg via INTRA_ARTICULAR

## 2020-10-23 NOTE — Progress Notes (Signed)
Subjective:   Patient ID: Michelle Morse, female   DOB: 35 y.o.   MRN: [id number]   HPI 35 year old female presents the office today for concerns of left foot pain.  She states that her foot started bothering her about a week ago and she points in between the fourth and fifth toes in the sulcus where she gets discomfort.  She states that it feels like pressure underneath the toes.  She denies any recent injury or trauma.  She has no numbness or tingling.  Denies any open lesions.  She has no other concerns.   Review of Systems  All other systems reviewed and are negative.  Past Medical History:  Diagnosis Date  . Hidradenitis suppurativa   . Hypertension   . Insulin resistance   . Obesity   . Snoring     Past Surgical History:  Procedure Laterality Date  . NO PAST SURGERIES       Current Outpatient Medications:  .  ketoconazole (NIZORAL) 2 % cream, Apply 1 application topically daily., Disp: 60 g, Rfl: 0 .  meloxicam (MOBIC) 15 MG tablet, Take 1 tablet (15 mg total) by mouth daily., Disp: 30 tablet, Rfl: 0 .  acetaminophen (TYLENOL) 500 MG tablet, Take 1,000 mg by mouth every 6 (six) hours as needed (severe headache)., Disp: , Rfl:  .  aspirin-acetaminophen-caffeine (EXCEDRIN MIGRAINE) 250-250-65 MG tablet, Take 2 tablets by mouth every 6 (six) hours as needed for headache or migraine., Disp: , Rfl:  .  Clobetasol Propionate (TEMOVATE) 0.05 % external spray, Apply 1 application topically daily as needed (scalp itching/ dermatitis)., Disp: , Rfl:  .  diphenhydrAMINE HCl, Sleep, (ZZZQUIL PO), Take 1 capsule by mouth at bedtime as needed (sleep)., Disp: , Rfl:  .  diphenoxylate-atropine (LOMOTIL) 2.5-0.025 MG tablet, Take 1 tablet by mouth 4 (four) times daily as needed for diarrhea or loose stools., Disp: 15 tablet, Rfl: 0 .  hydrochlorothiazide (HYDRODIURIL) 25 MG tablet, Take 25 mg by mouth every morning., Disp: , Rfl:  .  norethindrone (MICRONOR) 0.35 MG tablet, Take 1 tablet  by mouth daily with supper., Disp: , Rfl:  .  ondansetron (ZOFRAN ODT) 4 MG disintegrating tablet, Take 1 tablet (4 mg total) by mouth every 8 (eight) hours as needed for nausea or vomiting., Disp: 20 tablet, Rfl: 0 .  potassium chloride 20 MEQ TBCR, Take 40 mEq by mouth daily for 5 days., Disp: 10 tablet, Rfl: 0 .  promethazine (PHENERGAN) 25 MG suppository, Place 1 suppository (25 mg total) rectally every 8 (eight) hours as needed for nausea or vomiting., Disp: 15 each, Rfl: 0 .  Semaglutide, 1 MG/DOSE, (OZEMPIC, 1 MG/DOSE,) 4 MG/3ML SOPN, Inject 1 mg into the skin every Saturday., Disp: , Rfl:  .  Vitamin D, Ergocalciferol, (DRISDOL) 1.25 MG (50000 UNIT) CAPS capsule, Take 50,000 Units by mouth every Monday., Disp: , Rfl:   No Known Allergies       Objective:  Physical Exam  General: AAO x3, NAD  Dermatological: There is minimal tinea pedis present on the fourth interspace of the left foot.  There is no erythema or warmth and no pustules.  There is be almost more dry skin which was peeling.  There are no open sores, no preulcerative lesions, no rash or signs of infection present.  Vascular: Dorsalis Pedis artery and Posterior Tibial artery pedal pulses are 2/4 bilateral with immedate capillary fill time.There is no pain with calf compression, swelling, warmth, erythema.   Neruologic: Grossly intact via light  touch bilateral.   Musculoskeletal: Tenderness is just in between the fourth and fifth metatarsal heads of the left foot.  No palpable neuroma identified.  There is no area pinpoint tenderness.  No edema, erythema.  Adductovarus was present of the fifth digit of the left foot.  Muscular strength 5/5 in all groups tested bilateral.  Gait: Unassisted, Nonantalgic.     Assessment:   Left foot tendinitis; mild tinea pedis     Morse:  -Treatment options discussed including all alternatives, risks, and complications -Etiology of symptoms were discussed -X-rays were obtained and  reviewed with the patient.  Adductovarus present fifth digit.  No evidence of acute fracture. -We discussed steroid injection she wants this today.  Skin was prepped with alcohol and a mixture of 0.5 cc of Marcaine plain, 0.5 cc of dexamethasone phosphate was infiltrated into the fourth interspace left without complications.  She tolerated well.  Postinjection care discussed. -Dispensed toe separator -Ketoconazole  Return for left foot pain in 4-6 weeks.  Vivi Barrack DPM

## 2020-11-03 ENCOUNTER — Other Ambulatory Visit: Payer: Self-pay | Admitting: Neurosurgery

## 2020-11-03 DIAGNOSIS — M5126 Other intervertebral disc displacement, lumbar region: Secondary | ICD-10-CM

## 2020-11-19 ENCOUNTER — Other Ambulatory Visit: Payer: Self-pay

## 2020-11-19 ENCOUNTER — Ambulatory Visit
Admission: RE | Admit: 2020-11-19 | Discharge: 2020-11-19 | Disposition: A | Discharge status: Home / Self Care | Payer: 59 | Source: Ambulatory Visit | Attending: Neurosurgery | Admitting: Neurosurgery

## 2020-11-19 DIAGNOSIS — M5126 Other intervertebral disc displacement, lumbar region: Secondary | ICD-10-CM

## 2021-03-27 ENCOUNTER — Inpatient Hospital Stay (HOSPITAL_COMMUNITY)
Admission: EM | Admit: 2021-03-27 | Discharge: 2021-04-01 | DRG: 392 | Disposition: A | Discharge status: Home / Self Care | Payer: 59 | Attending: Family Medicine | Admitting: Family Medicine

## 2021-03-27 ENCOUNTER — Encounter (HOSPITAL_COMMUNITY): Payer: Self-pay

## 2021-03-27 ENCOUNTER — Other Ambulatory Visit: Payer: Self-pay

## 2021-03-27 DIAGNOSIS — K56609 Unspecified intestinal obstruction, unspecified as to partial versus complete obstruction: Secondary | ICD-10-CM

## 2021-03-27 DIAGNOSIS — R8290 Unspecified abnormal findings in urine: Secondary | ICD-10-CM | POA: Diagnosis present

## 2021-03-27 DIAGNOSIS — K5900 Constipation, unspecified: Secondary | ICD-10-CM | POA: Diagnosis not present

## 2021-03-27 DIAGNOSIS — Z791 Long term (current) use of non-steroidal anti-inflammatories (NSAID): Secondary | ICD-10-CM

## 2021-03-27 DIAGNOSIS — N915 Oligomenorrhea, unspecified: Secondary | ICD-10-CM | POA: Diagnosis present

## 2021-03-27 DIAGNOSIS — Z79899 Other long term (current) drug therapy: Secondary | ICD-10-CM

## 2021-03-27 DIAGNOSIS — Z20822 Contact with and (suspected) exposure to covid-19: Secondary | ICD-10-CM | POA: Diagnosis present

## 2021-03-27 DIAGNOSIS — E669 Obesity, unspecified: Secondary | ICD-10-CM | POA: Diagnosis present

## 2021-03-27 DIAGNOSIS — R197 Diarrhea, unspecified: Secondary | ICD-10-CM

## 2021-03-27 DIAGNOSIS — Z6841 Body Mass Index (BMI) 40.0 and over, adult: Secondary | ICD-10-CM

## 2021-03-27 DIAGNOSIS — E8881 Metabolic syndrome: Secondary | ICD-10-CM | POA: Diagnosis present

## 2021-03-27 DIAGNOSIS — I10 Essential (primary) hypertension: Secondary | ICD-10-CM | POA: Diagnosis present

## 2021-03-27 DIAGNOSIS — Z8249 Family history of ischemic heart disease and other diseases of the circulatory system: Secondary | ICD-10-CM

## 2021-03-27 DIAGNOSIS — K567 Ileus, unspecified: Secondary | ICD-10-CM

## 2021-03-27 DIAGNOSIS — R112 Nausea with vomiting, unspecified: Secondary | ICD-10-CM | POA: Diagnosis present

## 2021-03-27 DIAGNOSIS — F17210 Nicotine dependence, cigarettes, uncomplicated: Secondary | ICD-10-CM | POA: Diagnosis present

## 2021-03-27 DIAGNOSIS — G4733 Obstructive sleep apnea (adult) (pediatric): Secondary | ICD-10-CM | POA: Diagnosis present

## 2021-03-27 DIAGNOSIS — E876 Hypokalemia: Secondary | ICD-10-CM | POA: Diagnosis present

## 2021-03-27 LAB — URINALYSIS, ROUTINE W REFLEX MICROSCOPIC
Bilirubin Urine: NEGATIVE
Glucose, UA: NEGATIVE mg/dL
Hgb urine dipstick: NEGATIVE
Ketones, ur: 80 mg/dL — AB
Leukocytes,Ua: NEGATIVE
Nitrite: POSITIVE — AB
Protein, ur: 100 mg/dL — AB
Specific Gravity, Urine: 1.026 (ref 1.005–1.030)
pH: 6 (ref 5.0–8.0)

## 2021-03-27 LAB — CBC WITH DIFFERENTIAL/PLATELET
Abs Immature Granulocytes: 0.03 10*3/uL (ref 0.00–0.07)
Basophils Absolute: 0 10*3/uL (ref 0.0–0.1)
Basophils Relative: 0 %
Eosinophils Absolute: 0 10*3/uL (ref 0.0–0.5)
Eosinophils Relative: 0 %
HCT: 39.6 % (ref 36.0–46.0)
Hemoglobin: 13.8 g/dL (ref 12.0–15.0)
Immature Granulocytes: 1 %
Lymphocytes Relative: 15 %
Lymphs Abs: 1 10*3/uL (ref 0.7–4.0)
MCH: 29.7 pg (ref 26.0–34.0)
MCHC: 34.8 g/dL (ref 30.0–36.0)
MCV: 85.2 fL (ref 80.0–100.0)
Monocytes Absolute: 0.3 10*3/uL (ref 0.1–1.0)
Monocytes Relative: 5 %
Neutro Abs: 4.9 10*3/uL (ref 1.7–7.7)
Neutrophils Relative %: 79 %
Platelets: 367 10*3/uL (ref 150–400)
RBC: 4.65 MIL/uL (ref 3.87–5.11)
RDW: 14 % (ref 11.5–15.5)
WBC: 6.2 10*3/uL (ref 4.0–10.5)
nRBC: 0 % (ref 0.0–0.2)

## 2021-03-27 LAB — RESP PANEL BY RT-PCR (FLU A&B, COVID) ARPGX2
Influenza A by PCR: NEGATIVE
Influenza B by PCR: NEGATIVE
SARS Coronavirus 2 by RT PCR: NEGATIVE

## 2021-03-27 LAB — COMPREHENSIVE METABOLIC PANEL
ALT: 17 U/L (ref 0–44)
AST: 20 U/L (ref 15–41)
Albumin: 4.1 g/dL (ref 3.5–5.0)
Alkaline Phosphatase: 60 U/L (ref 38–126)
Anion gap: 12 (ref 5–15)
BUN: 10 mg/dL (ref 6–20)
CO2: 22 mmol/L (ref 22–32)
Calcium: 9 mg/dL (ref 8.9–10.3)
Chloride: 101 mmol/L (ref 98–111)
Creatinine, Ser: 0.91 mg/dL (ref 0.44–1.00)
GFR, Estimated: >60 mL/min (ref >60)
Glucose, Bld: 131 mg/dL — ABNORMAL HIGH (ref 70–99)
Potassium: 3.3 mmol/L — ABNORMAL LOW (ref 3.5–5.1)
Sodium: 135 mmol/L (ref 135–145)
Total Bilirubin: 1.1 mg/dL (ref 0.3–1.2)
Total Protein: 7.9 g/dL (ref 6.5–8.1)

## 2021-03-27 LAB — LIPASE, BLOOD: Lipase: 29 U/L (ref 11–51)

## 2021-03-27 LAB — I-STAT BETA HCG BLOOD, ED (MC, WL, AP ONLY): I-stat hCG, quantitative: <5 m[IU]/mL (ref <5)

## 2021-03-27 LAB — MAGNESIUM: Magnesium: 2.2 mg/dL (ref 1.7–2.4)

## 2021-03-27 MED ORDER — ONDANSETRON HCL 4 MG/2ML IJ SOLN
4.0000 mg | Freq: Four times a day (QID) | INTRAMUSCULAR | Status: DC | PRN
  Administered 2021-03-29: 4 mg via INTRAVENOUS
  Filled 2021-03-27 (×2): qty 2

## 2021-03-27 MED ORDER — SODIUM CHLORIDE 0.9 % IV SOLN: INTRAVENOUS | Status: AC

## 2021-03-27 MED ORDER — SODIUM CHLORIDE 0.9 % IV BOLUS
1000.0000 mL | Freq: Once | INTRAVENOUS | Status: AC
  Administered 2021-03-27: 1000 mL via INTRAVENOUS

## 2021-03-27 MED ORDER — ONDANSETRON HCL 4 MG/2ML IJ SOLN
4.0000 mg | Freq: Once | INTRAMUSCULAR | Status: AC
  Administered 2021-03-27: 4 mg via INTRAVENOUS
  Filled 2021-03-27: qty 2

## 2021-03-27 MED ORDER — ASPIRIN-ACETAMINOPHEN-CAFFEINE 250-250-65 MG PO TABS
2.0000 | ORAL_TABLET | Freq: Four times a day (QID) | ORAL | Status: DC | PRN
  Filled 2021-03-27: qty 2

## 2021-03-27 MED ORDER — POTASSIUM CHLORIDE 10 MEQ/100ML IV SOLN
10.0000 meq | INTRAVENOUS | Status: AC
  Administered 2021-03-27 (×2): 10 meq via INTRAVENOUS
  Filled 2021-03-27 (×2): qty 100

## 2021-03-27 MED ORDER — SODIUM CHLORIDE 0.9 % IV SOLN
12.5000 mg | Freq: Four times a day (QID) | INTRAVENOUS | Status: DC | PRN
  Administered 2021-03-27: 12.5 mg via INTRAVENOUS
  Filled 2021-03-27: qty 0.5

## 2021-03-27 MED ORDER — NORETHINDRONE 0.35 MG PO TABS: 1.0000 | ORAL_TABLET | Freq: Every day | ORAL | Status: DC

## 2021-03-27 MED ORDER — METOCLOPRAMIDE HCL 5 MG/ML IJ SOLN
5.0000 mg | Freq: Four times a day (QID) | INTRAMUSCULAR | Status: DC | PRN
  Administered 2021-03-27 – 2021-04-01 (×8): 5 mg via INTRAVENOUS
  Filled 2021-03-27 (×9): qty 2

## 2021-03-27 MED ORDER — SODIUM CHLORIDE 0.9 % IV BOLUS
2000.0000 mL | Freq: Once | INTRAVENOUS | Status: AC
  Administered 2021-03-27: 2000 mL via INTRAVENOUS

## 2021-03-27 MED ORDER — ONDANSETRON HCL 4 MG/2ML IJ SOLN: 4.0000 mg | Freq: Four times a day (QID) | INTRAMUSCULAR | Status: DC | PRN

## 2021-03-27 MED ORDER — HYDROCHLOROTHIAZIDE 25 MG PO TABS
25.0000 mg | ORAL_TABLET | Freq: Every morning | ORAL | Status: DC
  Administered 2021-03-28: 25 mg via ORAL
  Filled 2021-03-27: qty 1

## 2021-03-27 MED ORDER — SODIUM CHLORIDE 0.9% FLUSH
3.0000 mL | Freq: Two times a day (BID) | INTRAVENOUS | Status: DC
  Administered 2021-03-28 – 2021-03-31 (×7): 3 mL via INTRAVENOUS

## 2021-03-27 MED ORDER — MAGNESIUM SULFATE 2 GM/50ML IV SOLN
2.0000 g | Freq: Once | INTRAVENOUS | Status: AC
  Administered 2021-03-27: 2 g via INTRAVENOUS
  Filled 2021-03-27: qty 50

## 2021-03-27 MED ORDER — ENOXAPARIN SODIUM 40 MG/0.4ML IJ SOSY
40.0000 mg | PREFILLED_SYRINGE | Freq: Every day | INTRAMUSCULAR | Status: DC
  Administered 2021-03-28 – 2021-04-01 (×5): 40 mg via SUBCUTANEOUS
  Filled 2021-03-27 (×5): qty 0.4

## 2021-03-27 MED ORDER — DIPHENHYDRAMINE HCL 25 MG PO CAPS: 25.0000 mg | ORAL_CAPSULE | Freq: Every evening | ORAL | Status: DC | PRN

## 2021-03-27 NOTE — ED Triage Notes (Signed)
Patient complains of abdominal pain with emesis x 5 since 0300. Patient denies diarrhea. Patient alert and oriented

## 2021-03-27 NOTE — ED Provider Notes (Signed)
[facility] EMERGENCY DEPARTMENT Provider Note   CSN: [id number] Arrival date & time: 03/27/21  1009     History No chief complaint on file.   Michelle Morse is a 35 y.o. female.  HPI She presents for evaluation of abdominal pain with vomiting, which started today.  She also had several episodes of diarrhea which has ceased.  She is concerned about food poisoning because she ate mussels and shrimp last night at a restaurant.  She previously had the same problem several months ago when she had an episode of food poisoning requiring evaluation in the ED.  Her main complaint at this time is nausea.  She has not seen any blood in her emesis.  She states that yesterday she took some Azo for urinary dysuria.  No other urinary tract symptoms at this time.  There are no other known active modifying factors.    Past Medical History:  Diagnosis Date   Hidradenitis suppurativa    Hypertension    Insulin resistance    Obesity    Snoring     Patient Active Problem List   Diagnosis Date Noted   Intractable vomiting 10/04/2020   Hidradenitis suppurativa 10/04/2020   Essential hypertension 09/09/2019   Insulin resistance 02/26/2019   OSA (obstructive sleep apnea) 04/27/2016   Super obese 04/27/2016   Sleep related choking sensation 04/27/2016    Past Surgical History:  Procedure Laterality Date   NO PAST SURGERIES       OB History   No obstetric history on file.     Family History  Problem Relation Age of Onset   Cancer Mother    Hypertension Mother    Cancer Father    Hypertension Father    Hypertension Sister     Social History   Tobacco Use   Smoking status: Every Day    Packs/day: 0.25    Types: Cigarettes   Smokeless tobacco: Never  Vaping Use   Vaping Use: Never used  Substance Use Topics   Alcohol use: No   Drug use: No    Home Medications Prior to Admission medications   Medication Sig Start Date End Date Taking? Authorizing  Provider  acetaminophen (TYLENOL) 500 MG tablet Take 1,000 mg by mouth every 6 (six) hours as needed (severe headache).    [provider]  aspirin-acetaminophen-caffeine (EXCEDRIN MIGRAINE) [phone] MG tablet Take 2 tablets by mouth every 6 (six) hours as needed for headache or migraine.    [provider]  Clobetasol Propionate (TEMOVATE) 0.05 % external spray Apply 1 application topically daily as needed (scalp itching/ dermatitis). 10/01/20   [provider]  diphenhydrAMINE HCl, Sleep, (ZZZQUIL PO) Take 1 capsule by mouth at bedtime as needed (sleep).    [provider]  diphenoxylate-atropine (LOMOTIL) 2.5-0.025 MG tablet Take 1 tablet by mouth 4 (four) times daily as needed for diarrhea or loose stools. 10/07/20   Burnadette Pop, MD  hydrochlorothiazide (HYDRODIURIL) 25 MG tablet Take 25 mg by mouth every morning. 09/30/20   [provider]  ketoconazole (NIZORAL) 2 % cream Apply 1 application topically daily. 10/20/20   Vivi Barrack, DPM  meloxicam (MOBIC) 15 MG tablet Take 1 tablet (15 mg total) by mouth daily. 10/20/20 10/20/21  Vivi Barrack, DPM  norethindrone (MICRONOR) 0.35 MG tablet Take 1 tablet by mouth daily with supper.    [provider]  ondansetron (ZOFRAN ODT) 4 MG disintegrating tablet Take 1 tablet (4 mg total) by mouth every 8 (  eight) hours as needed for nausea or vomiting. 10/02/20   Pollyann Savoy, MD  potassium chloride 20 MEQ TBCR Take 40 mEq by mouth daily for 5 days. 10/07/20 10/12/20  Burnadette Pop, MD  promethazine (PHENERGAN) 25 MG suppository Place 1 suppository (25 mg total) rectally every 8 (eight) hours as needed for nausea or vomiting. 10/07/20   Burnadette Pop, MD  Semaglutide, 1 MG/DOSE, (OZEMPIC, 1 MG/DOSE,) 4 MG/3ML SOPN Inject 1 mg into the skin every Saturday.    [provider]  Vitamin D, Ergocalciferol, (DRISDOL) 1.25 MG (50000 UNIT) CAPS capsule Take 50,000 Units by mouth every  Monday. 09/10/20   [provider]    Allergies    Patient has no known allergies.  Review of Systems   Review of Systems  All other systems reviewed and are negative.  Physical Exam Updated Vital Signs BP (!) 154/111   Pulse (!) 105   Temp 98 F (36.7 C) (Oral)   Resp 20   SpO2 100%   Physical Exam Vitals and nursing note reviewed.  Constitutional:      General: She is not in acute distress.    Appearance: She is well-developed. She is obese. She is not ill-appearing, toxic-appearing or diaphoretic.  HENT:     Head: Normocephalic and atraumatic.     Right Ear: External ear normal.     Left Ear: External ear normal.  Eyes:     Conjunctiva/sclera: Conjunctivae normal.     Pupils: Pupils are equal, round, and reactive to light.  Neck:     Trachea: Phonation normal.  Cardiovascular:     Rate and Rhythm: Normal rate.  Pulmonary:     Effort: Pulmonary effort is normal.  Abdominal:     General: There is no distension.     Palpations: Abdomen is soft.     Tenderness: There is no abdominal tenderness. There is no guarding.  Musculoskeletal:        General: Normal range of motion.     Cervical back: Normal range of motion and neck supple.  Skin:    General: Skin is warm and dry.  Neurological:     Mental Status: She is alert and oriented to person, place, and time.     Cranial Nerves: No cranial nerve deficit.     Sensory: No sensory deficit.     Motor: No abnormal muscle tone.     Coordination: Coordination normal.  Psychiatric:        Mood and Affect: Mood normal.        Behavior: Behavior normal.        Thought Content: Thought content normal.        Judgment: Judgment normal.    ED Results / Procedures / Treatments   Labs (all labs ordered are listed, but only abnormal results are displayed) Labs Reviewed  COMPREHENSIVE METABOLIC PANEL - Abnormal; Notable for the following components:      Result Value   Potassium 3.3 (*)    Glucose, Bld 131 (*)     All other components within normal limits  URINALYSIS, ROUTINE W REFLEX MICROSCOPIC - Abnormal; Notable for the following components:   Color, Urine AMBER (*)    APPearance HAZY (*)    Ketones, ur 80 (*)    Protein, ur 100 (*)    Nitrite POSITIVE (*)    Bacteria, UA RARE (*)    All other components within normal limits  RESP PANEL BY RT-PCR (FLU A&B, COVID) ARPGX2  CBC  WITH DIFFERENTIAL/PLATELET  LIPASE, BLOOD  MAGNESIUM  I-STAT BETA HCG BLOOD, ED ([facility], [facility], [facility] ONLY)  CBG MONITORING, ED    EKG EKG Interpretation  Date/Time:  Saturday March 27 2021 10:43:18 EDT Ventricular Rate:  92 PR Interval:  208 QRS Duration: 82 QT Interval:  296 QTC Calculation: 366 R Axis:   1 Text Interpretation: Normal sinus rhythm Nonspecific T wave abnormality Abnormal ECG since last tracing no significant change Confirmed by Mancel Bale [phone]) on 03/27/2021 8:37:51 PM  Radiology No results found.  Procedures Procedures   Medications Ordered in ED Medications  promethazine (PHENERGAN) 12.5 mg in sodium chloride 0.9 % 50 mL IVPB (has no administration in time range)  potassium chloride 10 mEq in 100 mL IVPB (0 mEq Intravenous Stopped 03/27/21 1950)  magnesium sulfate IVPB 2 g 50 mL (0 g Intravenous Stopped 03/27/21 1800)  ondansetron (ZOFRAN) injection 4 mg (4 mg Intravenous Given 03/27/21 1647)  sodium chloride 0.9 % bolus 2,000 mL (2,000 mLs Intravenous New Bag/Given 03/27/21 1648)    ED Course  I have reviewed the triage vital signs and the nursing notes.  Pertinent labs & imaging results that were available during my care of the patient were reviewed by me and considered in my medical decision making (see chart for details).    MDM Rules/Calculators/A&P                            Patient Vitals for the past 24 hrs:  BP Temp Temp src Pulse Resp SpO2  03/27/21 1945 (!) 154/111 -- -- (!) 105 20 100 %  03/27/21 1820 (!) 149/88 -- -- 97 20 100 %  03/27/21 1700 (!) 142/88 -- -- --  (!) 31 --  03/27/21 1630 (!) 166/98 -- -- 96 (!) 40 100 %  03/27/21 1530 (!) 155/114 -- -- 93 19 100 %  03/27/21 1356 (!) 157/105 98 F (36.7 C) Oral 88 16 100 %  03/27/21 1046 (!) 140/105 (!) 97.1 F (36.2 C) -- 88 17 100 %    8:35 PM Reevaluation with update and discussion. After initial assessment and treatment, an updated evaluation reveals she continues to feel nauseated despite current treatment completed.  She would like to be admitted for observation.  Findings discussed and questions answered. Mancel Bale   Medical Decision Making:  This patient is presenting for evaluation of nausea, vomiting and diarrhea after eating suspected tainted food, which does require a range of treatment options, and is a complaint that involves a high risk of morbidity and mortality. The differential diagnoses include food poisoning, enteritis, metabolic disorder. I decided to review old records, and in summary middle-aged female with history of sleep apnea and recurrent episodes of intractable vomiting, presenting with vomiting and diarrhea.  I obtained additional historical information from her mother at the bedside.  Clinical Laboratory Tests Ordered, included CBC, Metabolic panel, and lipase, magnesium, pregnancy, viral panel . Review indicates normal findings except potassium low, abnormal urinalysis with ketones, protein, nitrite and rare bacteria.  Also notable on the urine was 21-50 epithelial cells concerning for external contamination..    Critical Interventions-clinical evaluation, laboratory testing, IV fluids, medication treatment, observation and reassessment  After These Interventions, the Patient was reevaluated and was found with vomiting, nonspecified etiology.  Potassium low, which was supplemented.  Urinalysis indicating mild dehydration.  Possible UTI I will however the urinalysis is contaminated, and she has only some dysuria, yesterday.  Symptoms are more  likely from the GI tract.   I do not see any indication for catheter urine sample at this time.  CRITICAL CARE-no Performed by: Mancel Bale  Nursing Notes Reviewed/ Care Coordinated Applicable Imaging Reviewed Interpretation of Laboratory Data incorporated into ED treatment  9:32 PM-discussed case with hospitalist who agrees to evaluate her for observation    Final Clinical Impression(s) / ED Diagnoses Final diagnoses:  Nausea vomiting and diarrhea  Hypokalemia    Rx / DC Orders ED Discharge Orders     None        Mancel Bale, MD 03/27/21 2134

## 2021-03-27 NOTE — H&P (Addendum)
History and Physical   Michelle Morse KVQ:[id number] DOB: 05/13/1986 DOA: 03/27/2021  PCP: Courtney Paris, NP   Patient coming from: Home  Chief Complaint: Nausea, vomiting, diarrhea  HPI: [name] is a 35 y.o. female with medical history significant of hypertension, hidradenitis suppurativa, OSA, obesity, insulin resistance who presents with ongoing abdominal pain nausea vomiting and diarrhea.  She states that she started having her symptoms about 1 day ago and she has had a few episodes diarrhea but this seems to be improving.  She also has had multiple episodes of nausea vomiting some associated abdominal pain.  She is concerned she may have some food poisoning as she ate out at a restaurant yesterday where she ate mussels and shrimp.  She denies any blood in her emesis.  She did have some dysuria yesterday and took Azo for this she feels like her symptoms are improving.  She denies fevers, chills, chest pain, shortness of breath, constipation.  She states that she had previous episode that was similar where she had to return to the ED 3 times due to recurrent symptoms before she was admitted and is very uncomfortable with going home.  ED Course: Vital signs in the ED significant for heart rate initially in the 90s to 100s with blood pressure in the 140s to 150s systolic.  Lab work-up showed BMP with potassium of 3.3 and glucose 130, magnesium normal.  CBC within normal limits.  Lipase normal.  Rest were panel flu COVID pending urinalysis with ketones, protein, nitrates and trace bacteria however there is no white cells and there were squamous cells concerning for contamination.  Patient given 3 L of fluid dose potassium and magnesium as well as IV Phenergan in the ED.  As above her symptoms not fully improved and she felt uncomfortable going home.  Review of Systems: As per HPI otherwise all other systems reviewed and are negative.  Past Medical History:  Diagnosis Date    Hidradenitis suppurativa    Hypertension    Insulin resistance    Obesity    Snoring     Past Surgical History:  Procedure Laterality Date   NO PAST SURGERIES      Social History  reports that she has been smoking cigarettes. She has been smoking an average of .25 packs per day. She has never used smokeless tobacco. She reports that she does not drink alcohol and does not use drugs.  No Known Allergies  Family History  Problem Relation Age of Onset   Cancer Mother    Hypertension Mother    Cancer Father    Hypertension Father    Hypertension Sister   Reviewed on admission  Prior to Admission medications   Medication Sig Start Date End Date Taking? Authorizing Provider  acetaminophen (TYLENOL) 500 MG tablet Take 1,000 mg by mouth every 6 (six) hours as needed (severe headache).    [provider]  aspirin-acetaminophen-caffeine (EXCEDRIN MIGRAINE) [phone] MG tablet Take 2 tablets by mouth every 6 (six) hours as needed for headache or migraine.    [provider]  Clobetasol Propionate (TEMOVATE) 0.05 % external spray Apply 1 application topically daily as needed (scalp itching/ dermatitis). 10/01/20   [provider]  diphenhydrAMINE HCl, Sleep, (ZZZQUIL PO) Take 1 capsule by mouth at bedtime as needed (sleep).    [provider]  diphenoxylate-atropine (LOMOTIL) 2.5-0.025 MG tablet Take 1 tablet by mouth 4 (four) times daily as needed for diarrhea or loose stools. 10/07/20   Burnadette Pop, MD  hydrochlorothiazide (HYDRODIURIL) 25 MG tablet Take 25 mg by mouth every morning. 09/30/20   [provider]  ketoconazole (NIZORAL) 2 % cream Apply 1 application topically daily. 10/20/20   Vivi Barrack, DPM  meloxicam (MOBIC) 15 MG tablet Take 1 tablet (15 mg total) by mouth daily. 10/20/20 10/20/21  Vivi Barrack, DPM  norethindrone (MICRONOR) 0.35 MG tablet Take 1 tablet by mouth daily with supper.    [provider]   ondansetron (ZOFRAN ODT) 4 MG disintegrating tablet Take 1 tablet (4 mg total) by mouth every 8 (eight) hours as needed for nausea or vomiting. 10/02/20   Pollyann Savoy, MD  potassium chloride 20 MEQ TBCR Take 40 mEq by mouth daily for 5 days. 10/07/20 10/12/20  Burnadette Pop, MD  promethazine (PHENERGAN) 25 MG suppository Place 1 suppository (25 mg total) rectally every 8 (eight) hours as needed for nausea or vomiting. 10/07/20   Burnadette Pop, MD  Semaglutide, 1 MG/DOSE, (OZEMPIC, 1 MG/DOSE,) 4 MG/3ML SOPN Inject 1 mg into the skin every Saturday.    [provider]  Vitamin D, Ergocalciferol, (DRISDOL) 1.25 MG (50000 UNIT) CAPS capsule Take 50,000 Units by mouth every Monday. 09/10/20   [provider]    Physical Exam: Vitals:   03/27/21 1700 03/27/21 1820 03/27/21 1945 03/27/21 2115  BP: (!) 142/88 (!) 149/88 (!) 154/111 (!) 159/74  Pulse:  97 (!) 105 (!) 101  Resp: (!) 31 20 20 18   Temp:      TempSrc:      SpO2:  100% 100% 100%   Physical Exam Constitutional:      General: She is not in acute distress.    Appearance: Normal appearance. She is obese.  HENT:     Head: Normocephalic and atraumatic.     Mouth/Throat:     Mouth: Mucous membranes are moist.     Pharynx: Oropharynx is clear.  Eyes:     Extraocular Movements: Extraocular movements intact.     Pupils: Pupils are equal, round, and reactive to light.  Cardiovascular:     Rate and Rhythm: Normal rate and regular rhythm.     Pulses: Normal pulses.     Heart sounds: Normal heart sounds.  Pulmonary:     Effort: Pulmonary effort is normal. No respiratory distress.     Breath sounds: Normal breath sounds.  Abdominal:     General: Bowel sounds are normal. There is no distension.     Palpations: Abdomen is soft.     Tenderness: There is no abdominal tenderness.  Musculoskeletal:        General: No swelling or deformity.  Skin:    General: Skin is warm and dry.  Neurological:     General: No  focal deficit present.     Mental Status: Mental status is at baseline.     Labs on Admission: I have personally reviewed following labs and imaging studies  CBC: Recent Labs  Lab 03/27/21 1051  WBC 6.2  NEUTROABS 4.9  HGB 13.8  HCT 39.6  MCV 85.2  PLT 367    Basic Metabolic Panel: Recent Labs  Lab 03/27/21 1051 03/27/21 1650  NA 135  --   K 3.3*  --   CL 101  --   CO2 22  --   GLUCOSE 131*  --   BUN 10  --   CREATININE 0.91  --   CALCIUM 9.0  --   MG  --  2.2    GFR: CrCl  cannot be calculated (Unknown ideal weight.).  Liver Function Tests: Recent Labs  Lab 03/27/21 1051  AST 20  ALT 17  ALKPHOS 60  BILITOT 1.1  PROT 7.9  ALBUMIN 4.1    Urine analysis:    Component Value Date/Time   COLORURINE AMBER (A) 03/27/2021 1012   APPEARANCEUR HAZY (A) 03/27/2021 1012   LABSPEC 1.026 03/27/2021 1012   PHURINE 6.0 03/27/2021 1012   GLUCOSEU NEGATIVE 03/27/2021 1012   HGBUR NEGATIVE 03/27/2021 1012   BILIRUBINUR NEGATIVE 03/27/2021 1012   KETONESUR 80 (A) 03/27/2021 1012   PROTEINUR 100 (A) 03/27/2021 1012   NITRITE POSITIVE (A) 03/27/2021 1012   LEUKOCYTESUR NEGATIVE 03/27/2021 1012    Radiological Exams on Admission: No results found.  EKG: Independently reviewed.  Normal sinus rhythm at 90 bpm.  Assessment/Plan Principal Problem:   Intractable nausea and vomiting Active Problems:   Super obese   Essential hypertension   Oligomenorrhea  Intractable nausea vomiting > Spoke with poisoning as she noticed that this started after eating in a restaurant yesterday where she had muscles and shrimp.  She also had diarrhea which seems to be improving but has had continued nausea and vomiting and her symptoms not significantly improved after initial treatment in the ED.  Also hesitant to go home after prior episode in April where she had returned to the ED 3 times before being admitted ultimately. > Also continue to have nausea and vomiting despite  Phenergan and Zofran in the ED. - We will continue to monitor overnight - Continue with IV fluids overnight - Continue with IV antiemetics as needed overnight (Phenergan and Zofran with Reglan for refractory) - Trend fever curve and white count  Hypertension - Continue home hydrochlorothiazide  Hypokalemia > Potassium noted to be 3.3 in the ED with normal magnesium - Received 20 mEq in the ED - We will continue to trend potassium  Abnormal uterine bleeding - Continue home norethindrone  Abnormal urinalysis > Did have he does protein and nitrate however no white cells though there was rare bacteria.  Also contaminated with squamous cells.  She reports some dysuria that she took Azo for yesterday this seems to be improved. -Continue to monitor symptoms recur can consider repeat urinalysis.  DVT prophylaxis: Lovenox Code Status:   Full  Family Communication:  Family updated bedside Disposition Plan:   Patient is from:  Home  Anticipated DC to:  Home  Anticipated DC date:  1 to 2 days  Anticipated DC barriers: None  Consults called:  None  Admission status:  Observation, MedSurg   Severity of Illness: The appropriate patient status for this patient is OBSERVATION. Observation status is judged to be reasonable and necessary in order to provide the required intensity of service to ensure the patient's safety. The patient's presenting symptoms, physical exam findings, and initial radiographic and laboratory data in the context of their medical condition is felt to place them at decreased risk for further clinical deterioration. Furthermore, it is anticipated that the patient will be medically stable for discharge from the hospital within 2 midnights of admission. The following factors support the patient status of observation.   " The patient's presenting symptoms include nausea, vomiting, diarrhea, abdominal pain. " The physical exam findings include stable physical exam, obese. " The  initial radiographic and laboratory data are  Lab work-up showed BMP with potassium of 3.3 and glucose 130, magnesium normal.  CBC within normal limits.  Lipase normal.  Rest were panel flu COVID pending urinalysis  with ketones, protein, nitrates and trace bacteria however there is no white cells and there were squamous cells concerning for contamination.    Synetta Fail MD [facility]  How to contact the [facility] Attending or Consulting provider 7A - 7P or covering provider during after hours 7P -7A, for this patient?   Check the care team in [facility] and look for a) attending/consulting [facility] provider listed and b) the [facility] team listed Log into [email or web address] and use [place] [facility] universal password to access. If you do not have the password, please contact the hospital operator. Locate the [facility] provider you are looking for under [facility] and page to a number that you can be directly reached. If you still have difficulty reaching the provider, please page the [facility] (Director on Call) for the Hospitalists listed on amion for assistance.  03/27/2021, 10:43 PM

## 2021-03-27 NOTE — ED Provider Notes (Signed)
Emergency Medicine Provider Triage Evaluation Note  Michelle Morse , a 35 y.o. female  was evaluated in triage.  Pt complains of nausea, vomtiing and diarrhea x this morning. She had a similar episode in the past, hospitalized for dehydration in the month of April. Last meal shrimp and mussels.  Review of Systems  Positive: Vomiting, nausea, Negative: Fever, chest, pain   Physical Exam  BP (!) 140/105 (BP Location: Left Arm)   Pulse 88   Temp (!) 97.1 F (36.2 C)   Resp 17   SpO2 100%  Gen:   Awake, no distress   Resp:  Normal effort  MSK:   Moves extremities without difficulty  Other:  Appears unwell, slow to respond but no active vomiting witnessed.   Medical Decision Making  Medically screening exam initiated at 10:49 AM.  Appropriate orders placed.  Michelle Morse was informed that the remainder of the evaluation will be completed by another provider, this initial triage assessment does not replace that evaluation, and the importance of remaining in the ED until their evaluation is complete.  ED Abdominal labs ordered.    Michelle Manges, PA-C 03/27/21 1050    Michelle Munch, MD 03/27/21 1606

## 2021-03-28 ENCOUNTER — Observation Stay (HOSPITAL_COMMUNITY): Payer: 59

## 2021-03-28 DIAGNOSIS — I10 Essential (primary) hypertension: Secondary | ICD-10-CM

## 2021-03-28 DIAGNOSIS — R112 Nausea with vomiting, unspecified: Secondary | ICD-10-CM | POA: Diagnosis not present

## 2021-03-28 LAB — CBC
HCT: 38.8 % (ref 36.0–46.0)
Hemoglobin: 13.7 g/dL (ref 12.0–15.0)
MCH: 30.1 pg (ref 26.0–34.0)
MCHC: 35.3 g/dL (ref 30.0–36.0)
MCV: 85.3 fL (ref 80.0–100.0)
Platelets: 394 10*3/uL (ref 150–400)
RBC: 4.55 MIL/uL (ref 3.87–5.11)
RDW: 14.2 % (ref 11.5–15.5)
WBC: 8.2 10*3/uL (ref 4.0–10.5)
nRBC: 0 % (ref 0.0–0.2)

## 2021-03-28 LAB — BASIC METABOLIC PANEL
Anion gap: 11 (ref 5–15)
BUN: 6 mg/dL (ref 6–20)
CO2: 21 mmol/L — ABNORMAL LOW (ref 22–32)
Calcium: 8.6 mg/dL — ABNORMAL LOW (ref 8.9–10.3)
Chloride: 103 mmol/L (ref 98–111)
Creatinine, Ser: 0.93 mg/dL (ref 0.44–1.00)
GFR, Estimated: >60 mL/min (ref >60)
Glucose, Bld: 115 mg/dL — ABNORMAL HIGH (ref 70–99)
Potassium: 4.2 mmol/L (ref 3.5–5.1)
Sodium: 135 mmol/L (ref 135–145)

## 2021-03-28 LAB — CBG MONITORING, ED: Glucose-Capillary: 87 mg/dL (ref 70–99)

## 2021-03-28 MED ORDER — SORBITOL 70 % SOLN
960.0000 mL | TOPICAL_OIL | Freq: Once | ORAL | Status: AC
  Administered 2021-03-28: 960 mL via RECTAL
  Filled 2021-03-28 (×2): qty 473

## 2021-03-28 MED ORDER — HYDRALAZINE HCL 20 MG/ML IJ SOLN
10.0000 mg | INTRAMUSCULAR | Status: DC | PRN
  Administered 2021-03-28: 10 mg via INTRAVENOUS
  Filled 2021-03-28: qty 1

## 2021-03-28 NOTE — Plan of Care (Signed)
  Problem: Activity: Goal: Risk for activity intolerance will decrease Outcome: Progressing   Problem: Nutrition: Goal: Adequate nutrition will be maintained Outcome: Not Progressing   

## 2021-03-28 NOTE — Progress Notes (Signed)
PROGRESS NOTE    Michelle Morse  TKZ:[id number] DOB: [birth year] DOA: 03/27/2021 PCP: Courtney Paris, NP    Brief Narrative:  35 y.o. female with medical history significant of hypertension, hidradenitis suppurativa, OSA, obesity, insulin resistance who presents with ongoing abdominal pain nausea vomiting and diarrhea.  Assessment & Plan:   Principal Problem:   Intractable nausea and vomiting Active Problems:   Super obese   Essential hypertension   Oligomenorrhea    Intractable nausea vomiting -Symptoms of n/v that awoke patient around 3am following shellfish dinner around 7pm earlier that evening -Initial concerns of possible gastroenteritis related to shellfish -Lytes and renal function unremarkable -Pt initially reported some improvement this AM, but continues to be nauseated when re-evaluated this afternoon -Decreased BS on exam with pt reporting no flatus today. Ordered and reviewed abd xray -cont supportive care. Cont IVF hydration -Will check TSH in AM   Hypertension - Given limited PO intake and continued symptoms, hold further diuretics for now until symptoms improve and pt can better tolerate PO reliably  Hypokalemia -Suspect related to presenting n/v -Corrected -repeat bmet in AM   Abnormal uterine bleeding - Continue home norethindrone   Abnormal urinalysis -Clinically seems stable from a urologic standpoint -Afebrile, no leukocytosis -Cont to hold off on abx at this time   DVT prophylaxis: Lovenox subq Code Status: Full Family Communication: Pt in room, family is at bedside  Status is: Observation  The patient remains OBS appropriate and will d/c before 2 midnights.  Dispo: The patient is from: Home              Anticipated d/c is to: Home              Patient currently is not medically stable to d/c.   Difficult to place patient No   Consultants:    Procedures:    Antimicrobials: Anti-infectives (From admission, onward)    None        Subjective: Still complaining of abd discomfort and nausea  Objective: Vitals:   03/28/21 0510 03/28/21 0655 03/28/21 1001 03/28/21 1555  BP: 116/77 128/90 (!) 161/90 (!) 182/103  Pulse: 94 97 69 80  Resp: 16 16 20  (!) 22  Temp:      TempSrc:      SpO2: 100% 100% 99% 99%    Intake/Output Summary (Last 24 hours) at 03/28/2021 1617 Last data filed at 03/27/2021 2126 Gross per 24 hour  Intake 1150 ml  Output --  Net 1150 ml   There were no vitals filed for this visit.  Examination: General exam: Awake, laying in bed, in nad Respiratory system: Normal respiratory effort, no wheezing Cardiovascular system: regular rate, s1, s2 Gastrointestinal system: Soft, nondistended, decreased BS Central nervous system: CN2-12 grossly intact, strength intact Extremities: Perfused, no clubbing Skin: Normal skin turgor, no notable skin lesions seen Psychiatry: Mood normal // no visual hallucinations   Data Reviewed: I have personally reviewed following labs and imaging studies  CBC: Recent Labs  Lab 03/27/21 1051 03/28/21 0350  WBC 6.2 8.2  NEUTROABS 4.9  --   HGB 13.8 13.7  HCT 39.6 38.8  MCV 85.2 85.3  PLT 367 394   Basic Metabolic Panel: Recent Labs  Lab 03/27/21 1051 03/27/21 1650 03/28/21 0350  NA 135  --  135  K 3.3*  --  4.2  CL 101  --  103  CO2 22  --  21*  GLUCOSE 131*  --  115*  BUN 10  --  6  CREATININE 0.91  --  0.93  CALCIUM 9.0  --  8.6*  MG  --  2.2  --    GFR: CrCl cannot be calculated (Unknown ideal weight.). Liver Function Tests: Recent Labs  Lab 03/27/21 1051  AST 20  ALT 17  ALKPHOS 60  BILITOT 1.1  PROT 7.9  ALBUMIN 4.1   Recent Labs  Lab 03/27/21 1051  LIPASE 29   No results for input(s): AMMONIA in the last 168 hours. Coagulation Profile: No results for input(s): INR, PROTIME in the last 168 hours. Cardiac Enzymes: No results for input(s): CKTOTAL, CKMB, CKMBINDEX, TROPONINI in the last 168 hours. BNP (last 3 results) No  results for input(s): PROBNP in the last 8760 hours. HbA1C: No results for input(s): HGBA1C in the last 72 hours. CBG: Recent Labs  Lab 03/28/21 0746  GLUCAP 87   Lipid Profile: No results for input(s): CHOL, HDL, LDLCALC, TRIG, CHOLHDL, LDLDIRECT in the last 72 hours. Thyroid Function Tests: No results for input(s): TSH, T4TOTAL, FREET4, T3FREE, THYROIDAB in the last 72 hours. Anemia Panel: No results for input(s): VITAMINB12, FOLATE, FERRITIN, TIBC, IRON, RETICCTPCT in the last 72 hours. Sepsis Labs: No results for input(s): PROCALCITON, LATICACIDVEN in the last 168 hours.  Recent Results (from the past 240 hour(s))  Resp Panel by RT-PCR (Flu A&B, Covid) Nasopharyngeal Swab     Status: None   Collection Time: 03/27/21  9:25 PM   Specimen: Nasopharyngeal Swab; Nasopharyngeal(NP) swabs in vial transport medium  Result Value Ref Range Status   SARS Coronavirus 2 by RT PCR NEGATIVE NEGATIVE Final    Comment: (NOTE) SARS-CoV-2 target nucleic acids are NOT DETECTED.  The SARS-CoV-2 RNA is generally detectable in upper respiratory specimens during the acute phase of infection. The lowest concentration of SARS-CoV-2 viral copies this assay can detect is 138 copies/mL. A negative result does not preclude SARS-Cov-2 infection and should not be used as the sole basis for treatment or other patient management decisions. A negative result may occur with  improper specimen collection/handling, submission of specimen other than nasopharyngeal swab, presence of viral mutation(s) within the areas targeted by this assay, and inadequate number of viral copies(<138 copies/mL). A negative result must be combined with clinical observations, patient history, and epidemiological information. The expected result is Negative.  Fact Sheet for Patients:  BloggerCourse.com  Fact Sheet for Healthcare Providers:  SeriousBroker.it  This test is no t  yet approved or cleared by the [place] FDA and  has been authorized for detection and/or diagnosis of SARS-CoV-2 by FDA under an Emergency Use Authorization (EUA). This EUA will remain  in effect (meaning this test can be used) for the duration of the COVID-19 declaration under Section 564(b)(1) of the Act, 21 U.S.C.section 360bbb-3(b)(1), unless the authorization is terminated  or revoked sooner.       Influenza A by PCR NEGATIVE NEGATIVE Final   Influenza B by PCR NEGATIVE NEGATIVE Final    Comment: (NOTE) The Xpert Xpress SARS-CoV-2/FLU/RSV plus assay is intended as an aid in the diagnosis of influenza from Nasopharyngeal swab specimens and should not be used as a sole basis for treatment. Nasal washings and aspirates are unacceptable for Xpert Xpress SARS-CoV-2/FLU/RSV testing.  Fact Sheet for Patients: BloggerCourse.com  Fact Sheet for Healthcare Providers: SeriousBroker.it  This test is not yet approved or cleared by the [place] FDA and has been authorized for detection and/or diagnosis of SARS-CoV-2 by FDA under an Emergency Use Authorization (EUA). This EUA will remain in  effect (meaning this test can be used) for the duration of the COVID-19 declaration under Section 564(b)(1) of the Act, 21 U.S.C. section 360bbb-3(b)(1), unless the authorization is terminated or revoked.  Performed at [facility], [address] [place], [place] [address]      Radiology Studies: DG Abd 1 View  Result Date: 03/28/2021 CLINICAL DATA:  Nausea, vomiting, stomach pain. EXAM: ABDOMEN - 1 VIEW COMPARISON:  None. FINDINGS: The bowel gas pattern is normal. Extensive bowel content is identified throughout colon. No radio-opaque calculi or other significant radiographic abnormality are seen. IMPRESSION: 1. No bowel obstruction. 2. Constipation. Electronically Signed   By: Sherian Rein M.D.   On: 03/28/2021 14:36     Scheduled Meds:  enoxaparin (LOVENOX) injection  40 mg Subcutaneous Daily   hydrochlorothiazide  25 mg Oral q morning   norethindrone  1 tablet Oral Q supper   sodium chloride flush  3 mL Intravenous Q12H   Continuous Infusions:  promethazine (PHENERGAN) injection (IM or IVPB) Stopped (03/27/21 2126)     LOS: 0 days   Rickey Barbara, MD [facility] Pager On Amion  If 7PM-7AM, please contact night-coverage 03/28/2021, 4:17 PM

## 2021-03-28 NOTE — ED Notes (Signed)
Lunch ordered 

## 2021-03-29 DIAGNOSIS — N915 Oligomenorrhea, unspecified: Secondary | ICD-10-CM | POA: Diagnosis present

## 2021-03-29 DIAGNOSIS — E8881 Metabolic syndrome: Secondary | ICD-10-CM | POA: Diagnosis present

## 2021-03-29 DIAGNOSIS — G4733 Obstructive sleep apnea (adult) (pediatric): Secondary | ICD-10-CM | POA: Diagnosis present

## 2021-03-29 DIAGNOSIS — Z20822 Contact with and (suspected) exposure to covid-19: Secondary | ICD-10-CM | POA: Diagnosis present

## 2021-03-29 DIAGNOSIS — Z791 Long term (current) use of non-steroidal anti-inflammatories (NSAID): Secondary | ICD-10-CM | POA: Diagnosis not present

## 2021-03-29 DIAGNOSIS — Z79899 Other long term (current) drug therapy: Secondary | ICD-10-CM | POA: Diagnosis not present

## 2021-03-29 DIAGNOSIS — E876 Hypokalemia: Secondary | ICD-10-CM | POA: Diagnosis present

## 2021-03-29 DIAGNOSIS — I10 Essential (primary) hypertension: Secondary | ICD-10-CM | POA: Diagnosis present

## 2021-03-29 DIAGNOSIS — R8290 Unspecified abnormal findings in urine: Secondary | ICD-10-CM | POA: Diagnosis present

## 2021-03-29 DIAGNOSIS — Z6841 Body Mass Index (BMI) 40.0 and over, adult: Secondary | ICD-10-CM | POA: Diagnosis not present

## 2021-03-29 DIAGNOSIS — Z8249 Family history of ischemic heart disease and other diseases of the circulatory system: Secondary | ICD-10-CM | POA: Diagnosis not present

## 2021-03-29 DIAGNOSIS — R112 Nausea with vomiting, unspecified: Secondary | ICD-10-CM | POA: Diagnosis present

## 2021-03-29 DIAGNOSIS — R197 Diarrhea, unspecified: Secondary | ICD-10-CM

## 2021-03-29 DIAGNOSIS — K5900 Constipation, unspecified: Secondary | ICD-10-CM | POA: Diagnosis present

## 2021-03-29 DIAGNOSIS — F17210 Nicotine dependence, cigarettes, uncomplicated: Secondary | ICD-10-CM | POA: Diagnosis present

## 2021-03-29 LAB — COMPREHENSIVE METABOLIC PANEL
ALT: 20 U/L (ref 0–44)
AST: 31 U/L (ref 15–41)
Albumin: 4 g/dL (ref 3.5–5.0)
Alkaline Phosphatase: 55 U/L (ref 38–126)
Anion gap: 13 (ref 5–15)
BUN: 7 mg/dL (ref 6–20)
CO2: 20 mmol/L — ABNORMAL LOW (ref 22–32)
Calcium: 8.9 mg/dL (ref 8.9–10.3)
Chloride: 101 mmol/L (ref 98–111)
Creatinine, Ser: 0.82 mg/dL (ref 0.44–1.00)
GFR, Estimated: >60 mL/min (ref >60)
Glucose, Bld: 104 mg/dL — ABNORMAL HIGH (ref 70–99)
Potassium: 2.9 mmol/L — ABNORMAL LOW (ref 3.5–5.1)
Sodium: 134 mmol/L — ABNORMAL LOW (ref 135–145)
Total Bilirubin: 1.4 mg/dL — ABNORMAL HIGH (ref 0.3–1.2)
Total Protein: 7.8 g/dL (ref 6.5–8.1)

## 2021-03-29 LAB — TSH: TSH: 0.424 u[IU]/mL (ref 0.350–4.500)

## 2021-03-29 MED ORDER — SORBITOL 70 % SOLN
960.0000 mL | TOPICAL_OIL | Freq: Once | ORAL | Status: AC
  Administered 2021-03-29: 960 mL via RECTAL
  Filled 2021-03-29: qty 473

## 2021-03-29 MED ORDER — POTASSIUM CHLORIDE 10 MEQ/100ML IV SOLN
10.0000 meq | INTRAVENOUS | Status: AC
  Administered 2021-03-29 – 2021-03-30 (×3): 10 meq via INTRAVENOUS
  Filled 2021-03-29 (×2): qty 100

## 2021-03-29 MED ORDER — POTASSIUM CHLORIDE 10 MEQ/100ML IV SOLN
10.0000 meq | INTRAVENOUS | Status: AC
  Administered 2021-03-29 (×3): 10 meq via INTRAVENOUS
  Filled 2021-03-29 (×5): qty 100

## 2021-03-29 NOTE — Progress Notes (Signed)
PROGRESS NOTE    Michelle Morse  TGG:[id number] DOB: [birth year] DOA: 03/27/2021 PCP: Courtney Paris, NP    Brief Narrative:  35 y.o. female with medical history significant of hypertension, hidradenitis suppurativa, OSA, obesity, insulin resistance who presents with ongoing abdominal pain nausea vomiting and diarrhea.  Assessment & Plan:   Principal Problem:   Intractable nausea and vomiting Active Problems:   Super obese   Essential hypertension   Oligomenorrhea    Intractable nausea vomiting possibly secondary to obstipation -Patient initially describes symptoms of n/v that awoke patient around 3am following shellfish dinner around 7pm earlier that evening -Initial concerns of possible gastroenteritis related to shellfish -Decreased bowel sounds were noted on exam.  Patient also reports history of constipation requiring fleets enemas at home. -Patient given trial of smog enema overnight with only small amounts of stool noted -We will order another smog enema, continue cathartics until good results -Once resolved, patient may be a good candidate for Linzess moving forward   Hypertension - Given limited PO intake and continued symptoms, hold further diuretics for now until symptoms improve and pt can better tolerate PO reliably -Stable otherwise at this time  Hypokalemia -Suspect related to presenting n/v -Potassium low at 2.9 this morning, will correct -Repeat basic metabolic panel in the morning.   Abnormal uterine bleeding - Continue home norethindrone   Abnormal urinalysis -Clinically seems stable from a urologic standpoint -Afebrile, no leukocytosis -Cont to hold off on abx at this time, no evidence of acute infection  DVT prophylaxis: Lovenox subq Code Status: Full Family Communication: Pt in room, family remains at bedside  Status is: Observation  The patient remains OBS appropriate and will d/c before 2 midnights.  Dispo: The patient is from: Home               Anticipated d/c is to: Home              Patient currently is not medically stable to d/c.   Difficult to place patient No   Consultants:    Procedures:    Antimicrobials: Anti-infectives (From admission, onward)    None       Subjective: Continues to report intermittent nausea  Objective: Vitals:   03/28/21 1820 03/28/21 2151 03/29/21 0558 03/29/21 0749  BP: (!) 118/54 112/90 (!) 144/85 (!) 142/84  Pulse: 96 86 88 87  Resp: 19 18 19 16   Temp: 99.2 F (37.3 C) 98.3 F (36.8 C) 98.8 F (37.1 C) 98.5 F (36.9 C)  TempSrc: Oral Oral  Oral  SpO2: 100% 90% 100% 100%  Weight: 114.1 kg  117.6 kg   Height: 5\' 5"  (1.651 m)      No intake or output data in the 24 hours ending 03/29/21 1417  Filed Weights   03/28/21 1820 03/29/21 0558  Weight: 114.1 kg 117.6 kg    Examination: General exam: Conversant, in no acute distress Respiratory system: normal chest rise, clear, no audible wheezing Cardiovascular system: regular rhythm, s1-s2 Gastrointestinal system: Nondistended, nontender, pos BS Central nervous system: No seizures, no tremors Extremities: No cyanosis, no joint deformities Skin: No rashes, no pallor Psychiatry: Affect normal // no auditory hallucinations   Data Reviewed: I have personally reviewed following labs and imaging studies  CBC: Recent Labs  Lab 03/27/21 1051 03/28/21 0350  WBC 6.2 8.2  NEUTROABS 4.9  --   HGB 13.8 13.7  HCT 39.6 38.8  MCV 85.2 85.3  PLT 367 394    Basic Metabolic Panel: Recent Labs  Lab 03/27/21 1051 03/27/21 1650 03/28/21 0350 03/29/21 0400  NA 135  --  135 134*  K 3.3*  --  4.2 2.9*  CL 101  --  103 101  CO2 22  --  21* 20*  GLUCOSE 131*  --  115* 104*  BUN 10  --  6 7  CREATININE 0.91  --  0.93 0.82  CALCIUM 9.0  --  8.6* 8.9  MG  --  2.2  --   --     GFR: Estimated Creatinine Clearance: 122.7 mL/min (by C-G formula based on SCr of 0.82 mg/dL). Liver Function Tests: Recent Labs  Lab  03/27/21 1051 03/29/21 0400  AST 20 31  ALT 17 20  ALKPHOS 60 55  BILITOT 1.1 1.4*  PROT 7.9 7.8  ALBUMIN 4.1 4.0    Recent Labs  Lab 03/27/21 1051  LIPASE 29    No results for input(s): AMMONIA in the last 168 hours. Coagulation Profile: No results for input(s): INR, PROTIME in the last 168 hours. Cardiac Enzymes: No results for input(s): CKTOTAL, CKMB, CKMBINDEX, TROPONINI in the last 168 hours. BNP (last 3 results) No results for input(s): PROBNP in the last 8760 hours. HbA1C: No results for input(s): HGBA1C in the last 72 hours. CBG: Recent Labs  Lab 03/28/21 0746  GLUCAP 87    Lipid Profile: No results for input(s): CHOL, HDL, LDLCALC, TRIG, CHOLHDL, LDLDIRECT in the last 72 hours. Thyroid Function Tests: Recent Labs    03/29/21 0400  TSH 0.424   Anemia Panel: No results for input(s): VITAMINB12, FOLATE, FERRITIN, TIBC, IRON, RETICCTPCT in the last 72 hours. Sepsis Labs: No results for input(s): PROCALCITON, LATICACIDVEN in the last 168 hours.  Recent Results (from the past 240 hour(s))  Resp Panel by RT-PCR (Flu A&B, Covid) Nasopharyngeal Swab     Status: None   Collection Time: 03/27/21  9:25 PM   Specimen: Nasopharyngeal Swab; Nasopharyngeal(NP) swabs in vial transport medium  Result Value Ref Range Status   SARS Coronavirus 2 by RT PCR NEGATIVE NEGATIVE Final    Comment: (NOTE) SARS-CoV-2 target nucleic acids are NOT DETECTED.  The SARS-CoV-2 RNA is generally detectable in upper respiratory specimens during the acute phase of infection. The lowest concentration of SARS-CoV-2 viral copies this assay can detect is 138 copies/mL. A negative result does not preclude SARS-Cov-2 infection and should not be used as the sole basis for treatment or other patient management decisions. A negative result may occur with  improper specimen collection/handling, submission of specimen other than nasopharyngeal swab, presence of viral mutation(s) within  the areas targeted by this assay, and inadequate number of viral copies(<138 copies/mL). A negative result must be combined with clinical observations, patient history, and epidemiological information. The expected result is Negative.  Fact Sheet for Patients:  BloggerCourse.com  Fact Sheet for Healthcare Providers:  SeriousBroker.it  This test is no t yet approved or cleared by the [place] FDA and  has been authorized for detection and/or diagnosis of SARS-CoV-2 by FDA under an Emergency Use Authorization (EUA). This EUA will remain  in effect (meaning this test can be used) for the duration of the COVID-19 declaration under Section 564(b)(1) of the Act, 21 U.S.C.section 360bbb-3(b)(1), unless the authorization is terminated  or revoked sooner.       Influenza A by PCR NEGATIVE NEGATIVE Final   Influenza B by PCR NEGATIVE NEGATIVE Final    Comment: (NOTE) The Xpert Xpress SARS-CoV-2/FLU/RSV plus assay is intended as an aid in  the diagnosis of influenza from Nasopharyngeal swab specimens and should not be used as a sole basis for treatment. Nasal washings and aspirates are unacceptable for Xpert Xpress SARS-CoV-2/FLU/RSV testing.  Fact Sheet for Patients: BloggerCourse.com  Fact Sheet for Healthcare Providers: SeriousBroker.it  This test is not yet approved or cleared by the [place] FDA and has been authorized for detection and/or diagnosis of SARS-CoV-2 by FDA under an Emergency Use Authorization (EUA). This EUA will remain in effect (meaning this test can be used) for the duration of the COVID-19 declaration under Section 564(b)(1) of the Act, 21 U.S.C. section 360bbb-3(b)(1), unless the authorization is terminated or revoked.  Performed at [facility], [address] [place], [place] [address]       Radiology Studies: DG Abd 1 View  Result  Date: 03/28/2021 CLINICAL DATA:  Nausea, vomiting, stomach pain. EXAM: ABDOMEN - 1 VIEW COMPARISON:  None. FINDINGS: The bowel gas pattern is normal. Extensive bowel content is identified throughout colon. No radio-opaque calculi or other significant radiographic abnormality are seen. IMPRESSION: 1. No bowel obstruction. 2. Constipation. Electronically Signed   By: Sherian Rein M.D.   On: 03/28/2021 14:36    Scheduled Meds:  enoxaparin (LOVENOX) injection  40 mg Subcutaneous Daily   norethindrone  1 tablet Oral Q supper   sodium chloride flush  3 mL Intravenous Q12H   Continuous Infusions:  potassium chloride 10 mEq (03/29/21 1301)   promethazine (PHENERGAN) injection (IM or IVPB) Stopped (03/27/21 2126)     LOS: 0 days   Rickey Barbara, MD [facility] Pager On Amion  If 7PM-7AM, please contact night-coverage 03/29/2021, 2:17 PM

## 2021-03-29 NOTE — Plan of Care (Signed)
  Problem: Health Behavior/Discharge Planning: Goal: Ability to manage health-related needs will improve Outcome: Progressing   Problem: Activity: Goal: Risk for activity intolerance will decrease Outcome: Progressing   Problem: Nutrition: Goal: Adequate nutrition will be maintained Outcome: Progressing   

## 2021-03-30 ENCOUNTER — Encounter (HOSPITAL_COMMUNITY): Payer: Self-pay | Admitting: Internal Medicine

## 2021-03-30 LAB — COMPREHENSIVE METABOLIC PANEL
ALT: 20 U/L (ref 0–44)
AST: 25 U/L (ref 15–41)
Albumin: 3.5 g/dL (ref 3.5–5.0)
Alkaline Phosphatase: 44 U/L (ref 38–126)
Anion gap: 12 (ref 5–15)
BUN: 9 mg/dL (ref 6–20)
CO2: 22 mmol/L (ref 22–32)
Calcium: 8.7 mg/dL — ABNORMAL LOW (ref 8.9–10.3)
Chloride: 100 mmol/L (ref 98–111)
Creatinine, Ser: 0.79 mg/dL (ref 0.44–1.00)
GFR, Estimated: >60 mL/min (ref >60)
Glucose, Bld: 95 mg/dL (ref 70–99)
Potassium: 3.3 mmol/L — ABNORMAL LOW (ref 3.5–5.1)
Sodium: 134 mmol/L — ABNORMAL LOW (ref 135–145)
Total Bilirubin: 1.5 mg/dL — ABNORMAL HIGH (ref 0.3–1.2)
Total Protein: 7 g/dL (ref 6.5–8.1)

## 2021-03-30 MED ORDER — SORBITOL 70 % SOLN
960.0000 mL | TOPICAL_OIL | Freq: Once | ORAL | Status: AC
  Administered 2021-03-30: 960 mL via RECTAL
  Filled 2021-03-30: qty 473

## 2021-03-30 MED ORDER — POTASSIUM CHLORIDE CRYS ER 20 MEQ PO TBCR
40.0000 meq | EXTENDED_RELEASE_TABLET | Freq: Two times a day (BID) | ORAL | Status: AC
  Administered 2021-03-30 – 2021-03-31 (×2): 40 meq via ORAL
  Filled 2021-03-30 (×2): qty 2

## 2021-03-30 MED ORDER — POLYETHYLENE GLYCOL 3350 17 GM/SCOOP PO POWD
1.0000 | Freq: Once | ORAL | Status: AC
  Administered 2021-03-30: 255 g via ORAL
  Filled 2021-03-30: qty 255

## 2021-03-30 MED ORDER — HYDROCHLOROTHIAZIDE 25 MG PO TABS
25.0000 mg | ORAL_TABLET | Freq: Every morning | ORAL | Status: DC
  Administered 2021-03-31 – 2021-04-01 (×2): 25 mg via ORAL
  Filled 2021-03-30 (×2): qty 1

## 2021-03-30 NOTE — Progress Notes (Signed)
PROGRESS NOTE    Michelle Morse  CZY:[id number] DOB: March 13, 1986 DOA: 03/27/2021 PCP: Courtney Paris, NP    Brief Narrative:  35 y.o. female with medical history significant of hypertension, hidradenitis suppurativa, OSA, obesity, insulin resistance who presents with ongoing abdominal pain nausea vomiting and diarrhea.  Assessment & Plan:   Principal Problem:   Intractable nausea and vomiting Active Problems:   Super obese   Essential hypertension   Oligomenorrhea   Nausea & vomiting    Intractable nausea vomiting possibly secondary to obstipation -Patient initially describes symptoms of n/v that awoke patient around 3am following shellfish dinner around 7pm earlier that evening -Initial concerns of possible gastroenteritis related to shellfish -Decreased bowel sounds were noted on exam.  With x-ray findings suggestive of marked constipation -Patient has now passed stool and flatus after multiple trials of smog enema. -This morning, no longer nauseated, tolerating full liquid diet and asking to advance diet.  Patient still clinically constipated.  We will give another trial of smog enema with MiraLAX bowel prep. -Recommend referral to outpatient GI when discharged   Hypertension - Given limited PO intake and continued symptoms, diuretics were initially held  -As symptoms have improved, will resume hctz  Hypokalemia -Suspect related to presenting n/v and limited PO intake -remains low today. Will replace -Repeat bmet in AM   Abnormal uterine bleeding - On norethindrone at home   Abnormal urinalysis -Clinically seems stable from a urologic standpoint -Afebrile, no leukocytosis -Cont to hold off on abx at this time, no evidence of acute infection  DVT prophylaxis: Lovenox subq Code Status: Full Family Communication: Pt in room, family remains at bedside  Status is: Inpatient  The patient will require care spanning > 2 midnights and should be moved to inpatient  because: Inpatient level of care appropriate due to severity of illness  Dispo: The patient is from: Home              Anticipated d/c is to: Home              Patient currently is not medically stable to d/c.   Difficult to place patient No   Consultants:    Procedures:    Antimicrobials: Anti-infectives (From admission, onward)    None       Subjective: Feels better today, still feeling constipated despite a moderate BM overnight  Objective: Vitals:   03/29/21 1700 03/29/21 2203 03/30/21 0518 03/30/21 0915  BP: (!) 141/90 (!) 140/98 122/81 120/66  Pulse: 90 88 75 72  Resp: 18 18 18 15   Temp: 99.5 F (37.5 C) 98.7 F (37.1 C) 98.8 F (37.1 C) 98.3 F (36.8 C)  TempSrc: Oral Oral Oral Oral  SpO2: 95% 99% 100% 99%  Weight:      Height:        Intake/Output Summary (Last 24 hours) at 03/30/2021 1610 Last data filed at 03/30/2021 1300 Gross per 24 hour  Intake 949.88 ml  Output --  Net 949.88 ml    Filed Weights   03/28/21 1820 03/29/21 0558  Weight: 114.1 kg 117.6 kg    Examination: General exam: Awake, laying in bed, in nad Respiratory system: Normal respiratory effort, no wheezing Cardiovascular system: regular rate, s1, s2 Gastrointestinal system: Soft, nondistended, positive BS Central nervous system: CN2-12 grossly intact, strength intact Extremities: Perfused, no clubbing Skin: Normal skin turgor, no notable skin lesions seen Psychiatry: Mood normal // no visual hallucinations   Data Reviewed: I have personally reviewed following labs and imaging studies  CBC: Recent Labs  Lab 03/27/21 1051 03/28/21 0350  WBC 6.2 8.2  NEUTROABS 4.9  --   HGB 13.8 13.7  HCT 39.6 38.8  MCV 85.2 85.3  PLT 367 394    Basic Metabolic Panel: Recent Labs  Lab 03/27/21 1051 03/27/21 1650 03/28/21 0350 03/29/21 0400 03/30/21 0345  NA 135  --  135 134* 134*  K 3.3*  --  4.2 2.9* 3.3*  CL 101  --  103 101 100  CO2 22  --  21* 20* 22  GLUCOSE 131*   --  115* 104* 95  BUN 10  --  6 7 9   CREATININE 0.91  --  0.82 0.79  CALCIUM 9.0  --  8.6* 8.9 8.7*  MG  --  2.2  --   --   --     GFR: Estimated Creatinine Clearance: 125.8 mL/min (by C-G formula based on SCr of 0.79 mg/dL). Liver Function Tests: Recent Labs  Lab 03/27/21 1051 03/29/21 0400 03/30/21 0345  AST 20 31 25   ALT 17 20 20   ALKPHOS 60 55 44  BILITOT 1.1 1.4* 1.5*  PROT 7.9 7.8 7.0  ALBUMIN 4.1 4.0 3.5    Recent Labs  Lab 03/27/21 1051  LIPASE 29    No results for input(s): AMMONIA in the last 168 hours. Coagulation Profile: No results for input(s): INR, PROTIME in the last 168 hours. Cardiac Enzymes: No results for input(s): CKTOTAL, CKMB, CKMBINDEX, TROPONINI in the last 168 hours. BNP (last 3 results) No results for input(s): PROBNP in the last 8760 hours. HbA1C: No results for input(s): HGBA1C in the last 72 hours. CBG: Recent Labs  Lab 03/28/21 0746  GLUCAP 87    Lipid Profile: No results for input(s): CHOL, HDL, LDLCALC, TRIG, CHOLHDL, LDLDIRECT in the last 72 hours. Thyroid Function Tests: Recent Labs    03/29/21 0400  TSH 0.424    Anemia Panel: No results for input(s): VITAMINB12, FOLATE, FERRITIN, TIBC, IRON, RETICCTPCT in the last 72 hours. Sepsis Labs: No results for input(s): PROCALCITON, LATICACIDVEN in the last 168 hours.  Recent Results (from the past 240 hour(s))  Resp Panel by RT-PCR (Flu A&B, Covid) Nasopharyngeal Swab     Status: None   Collection Time: 03/27/21  9:25 PM   Specimen: Nasopharyngeal Swab; Nasopharyngeal(NP) swabs in vial transport medium  Result Value Ref Range Status   SARS Coronavirus 2 by RT PCR NEGATIVE NEGATIVE Final    Comment: (NOTE) SARS-CoV-2 target nucleic acids are NOT DETECTED.  The SARS-CoV-2 RNA is generally detectable in upper respiratory specimens during the acute phase of infection. The lowest concentration of SARS-CoV-2 viral copies this assay can detect is 138 copies/mL. A  negative result does not preclude SARS-Cov-2 infection and should not be used as the sole basis for treatment or other patient management decisions. A negative result may occur with  improper specimen collection/handling, submission of specimen other than nasopharyngeal swab, presence of viral mutation(s) within the areas targeted by this assay, and inadequate number of viral copies(<138 copies/mL). A negative result must be combined with clinical observations, patient history, and epidemiological information. The expected result is Negative.  Fact Sheet for Patients:  05/28/21  Fact Sheet for Healthcare Providers:  05/29/21  This test is no t yet approved or cleared by the 05/27/21 FDA and  has been authorized for detection and/or diagnosis of SARS-CoV-2 by FDA under an Emergency Use Authorization (EUA). This EUA will remain  in effect (meaning this test can  be used) for the duration of the COVID-19 declaration under Section 564(b)(1) of the Act, 21 U.S.C.section 360bbb-3(b)(1), unless the authorization is terminated  or revoked sooner.       Influenza A by PCR NEGATIVE NEGATIVE Final   Influenza B by PCR NEGATIVE NEGATIVE Final    Comment: (NOTE) The Xpert Xpress SARS-CoV-2/FLU/RSV plus assay is intended as an aid in the diagnosis of influenza from Nasopharyngeal swab specimens and should not be used as a sole basis for treatment. Nasal washings and aspirates are unacceptable for Xpert Xpress SARS-CoV-2/FLU/RSV testing.  Fact Sheet for Patients: BloggerCourse.com  Fact Sheet for Healthcare Providers: SeriousBroker.it  This test is not yet approved or cleared by the [place] FDA and has been authorized for detection and/or diagnosis of SARS-CoV-2 by FDA under an Emergency Use Authorization (EUA). This EUA will remain in effect (meaning this test can  be used) for the duration of the COVID-19 declaration under Section 564(b)(1) of the Act, 21 U.S.C. section 360bbb-3(b)(1), unless the authorization is terminated or revoked.  Performed at [facility], [address], [place], [place] [address]       Radiology Studies: No results found.  Scheduled Meds:  enoxaparin (LOVENOX) injection  40 mg Subcutaneous Daily   norethindrone  1 tablet Oral Q supper   sodium chloride flush  3 mL Intravenous Q12H   Continuous Infusions:  promethazine (PHENERGAN) injection (IM or IVPB) Stopped (03/27/21 2126)     LOS: 1 day   Rickey Barbara, MD [facility] Pager On Amion  If 7PM-7AM, please contact night-coverage 03/30/2021, 4:10 PM

## 2021-03-31 ENCOUNTER — Inpatient Hospital Stay (HOSPITAL_COMMUNITY): Payer: 59

## 2021-03-31 LAB — COMPREHENSIVE METABOLIC PANEL
ALT: 18 U/L (ref 0–44)
AST: 20 U/L (ref 15–41)
Albumin: 3.3 g/dL — ABNORMAL LOW (ref 3.5–5.0)
Alkaline Phosphatase: 49 U/L (ref 38–126)
Anion gap: 8 (ref 5–15)
BUN: 10 mg/dL (ref 6–20)
CO2: 23 mmol/L (ref 22–32)
Calcium: 8.6 mg/dL — ABNORMAL LOW (ref 8.9–10.3)
Chloride: 104 mmol/L (ref 98–111)
Creatinine, Ser: 0.8 mg/dL (ref 0.44–1.00)
GFR, Estimated: >60 mL/min (ref >60)
Glucose, Bld: 99 mg/dL (ref 70–99)
Potassium: 3.2 mmol/L — ABNORMAL LOW (ref 3.5–5.1)
Sodium: 135 mmol/L (ref 135–145)
Total Bilirubin: 1 mg/dL (ref 0.3–1.2)
Total Protein: 6.5 g/dL (ref 6.5–8.1)

## 2021-03-31 LAB — CBC
HCT: 36.7 % (ref 36.0–46.0)
Hemoglobin: 12.7 g/dL (ref 12.0–15.0)
MCH: 29.4 pg (ref 26.0–34.0)
MCHC: 34.6 g/dL (ref 30.0–36.0)
MCV: 85 fL (ref 80.0–100.0)
Platelets: 330 10*3/uL (ref 150–400)
RBC: 4.32 MIL/uL (ref 3.87–5.11)
RDW: 14 % (ref 11.5–15.5)
WBC: 6.9 10*3/uL (ref 4.0–10.5)
nRBC: 0 % (ref 0.0–0.2)

## 2021-03-31 MED ORDER — SORBITOL 70 % SOLN
960.0000 mL | TOPICAL_OIL | Freq: Once | ORAL | Status: AC
  Administered 2021-03-31: 960 mL via RECTAL
  Filled 2021-03-31: qty 473

## 2021-03-31 MED ORDER — POTASSIUM CHLORIDE 20 MEQ PO PACK
40.0000 meq | PACK | Freq: Once | ORAL | Status: AC
  Administered 2021-03-31: 40 meq via ORAL
  Filled 2021-03-31: qty 2

## 2021-03-31 NOTE — TOC Initial Note (Signed)
Transition of Care [facility]) - Initial/Assessment Note    Patient Details  Name: Michelle Morse MRN: [id number] Date of Birth: [birth year]  Transition of Care [facility]) CM/SW Contact:    Tom-Johnson, Hershal Coria, RN Phone Number: 03/31/2021, 11:19 AM  Clinical Narrative:                 CM spoke with patient in her room. Mother, Thornell Mule at bedside. Presented to the ED with presents with ongoing abdominal pain, nausea, vomiting and diarrhea. States she lives alone and ha no children. Both parents are living and has one sister. Employed by the [employer] services and able to drive self. Has a PCP and Medically Insured with [facility]. Does not have DME's and states she does not need any. Medical workup continues. CM will continue to follow with needs.   Expected Discharge Plan: Home/Self Care Barriers to Discharge: Continued Medical Work up   Patient Goals and CMS Choice Patient states their goals for this hospitalization and ongoing recovery are:: To go home CMS Medicare.gov Compare Post Acute Care list provided to:: Patient Choice offered to / list presented to : NA  Expected Discharge Plan and Services Expected Discharge Plan: Home/Self Care   Discharge Planning Services: CM Consult Post Acute Care Choice: NA Living arrangements for the past 2 months: Single Family Home                 DME Arranged: N/A DME Agency: NA         HH Agency: NA        Prior Living Arrangements/Services Living arrangements for the past 2 months: Single Family Home Lives with:: Self Patient language and need for interpreter reviewed:: Yes Do you feel safe going back to the place where you live?: Yes      Need for Family Participation in Patient Care: Yes (Comment) Care giver support system in place?: Yes (comment)   Criminal Activity/Legal Involvement Pertinent to Current Situation/Hospitalization: No - Comment as needed  Activities of Daily Living Home Assistive  Devices/Equipment: None ADL Screening (condition at time of admission) Patient's cognitive ability adequate to safely complete daily activities?: Yes Is the patient deaf or have difficulty hearing?: No Does the patient have difficulty seeing, even when wearing glasses/contacts?: No Does the patient have difficulty concentrating, remembering, or making decisions?: No Patient able to express need for assistance with ADLs?: Yes Does the patient have difficulty dressing or bathing?: No Independently performs ADLs?: Yes (appropriate for developmental age) Does the patient have difficulty walking or climbing stairs?: No Weakness of Legs: None Weakness of Arms/Hands: None  Permission Sought/Granted Permission sought to share information with : Case Manager, Family Supports Permission granted to share information with : Yes, Verbal Permission Granted              Emotional Assessment Appearance:: Appears older than stated age Attitude/Demeanor/Rapport: Engaged Affect (typically observed): Accepting, Appropriate, Hopeful Orientation: : Oriented to Self, Oriented to Place, Oriented to  Time, Oriented to Situation Alcohol / Substance Use: Tobacco Use (Occassional.) Psych Involvement: No (comment)  Admission diagnosis:  Hypokalemia [E87.6] Ileus (HCC) [K56.7] Nausea vomiting and diarrhea [R11.2, R19.7] Intractable nausea and vomiting [R11.2] Nausea & vomiting [R11.2] Patient Active Problem List   Diagnosis Date Noted   Nausea & vomiting 03/29/2021   Intractable nausea and vomiting 03/27/2021   Intractable vomiting 10/04/2020   Hidradenitis suppurativa 10/04/2020   Essential hypertension 09/09/2019   Insulin resistance 02/26/2019   Oligomenorrhea 02/22/2019  OSA (obstructive sleep apnea) 04/27/2016   Super obese 04/27/2016   Sleep related choking sensation 04/27/2016   PCP:  Courtney Paris, NP Pharmacy:   [id number] - [place], [place] - [address] [place] [address] Phone: [phone] Fax: [phone]     Social Determinants of Health (SDOH) Interventions    Readmission Risk Interventions No flowsheet data found.

## 2021-03-31 NOTE — Plan of Care (Signed)
  Problem: Health Behavior/Discharge Planning: Goal: Ability to manage health-related needs will improve Outcome: Progressing   Problem: Clinical Measurements: Goal: Will remain free from infection Outcome: Progressing Goal: Diagnostic test results will improve Outcome: Progressing   Problem: Activity: Goal: Risk for activity intolerance will decrease Outcome: Progressing   

## 2021-03-31 NOTE — Progress Notes (Signed)
PROGRESS NOTE    Michelle Morse  SWH:[id number] DOB: November 18, 1985 DOA: 03/27/2021 PCP: Michelle Paris, NP    Brief Narrative:  This 35 years old female with PMH significant for hypertension, hidradenitis suppurativa, OSA, obesity, insulin resistance presented in the ED with complaints of ongoing abdominal pain associated nausea, vomiting and diarrhea.  Assessment & Plan:   Principal Problem:   Intractable nausea and vomiting Active Problems:   Super obese   Essential hypertension   Oligomenorrhea   Nausea & vomiting  Intractable Nausea, vomiting possibly secondary to constipation: Patient presented with nausea, vomiting that woke her up around 3 AM following shellfish dinner around 7 PM earlier that evening. Initial concern was for possible gastroenteritis related to shellfish. On abdominal exam there were decreased bowel sounds, X-ray findings suggestive of marked constipation. Patient was given smog enema and had a small bowel movement. Patient has been tolerating full liquid,  diet slowly advanced to soft. Patient still clinically constipated,  we will give a trial of smog enema with MiraLAX. Referral to outpatient GI when discharged.  Essential hypertension: Continue home blood pressure medications.  Hypokalemia: Suspect due to Nausea and vomiting and limited p.o. intake Replacement in progress, recheck labs.  Abnormal uterine bleeding: Continue norethindrone   Abnormal urinalysis: Patient remains afebrile without leukocytosis Continue to hold antibiotic at this time there is no infection.   DVT prophylaxis: Lovenox Code Status: Full code Family Communication: Mother at bedside Disposition Plan:   Status is: Inpatient  Remains inpatient appropriate because:Inpatient level of care appropriate due to severity of illness  Dispo: The patient is from: Home              Anticipated d/c is to: Home              Patient currently is not medically stable to d/c.    Difficult to place patient No   Consultants:  None  Procedures: None Antimicrobials: None  Subjective: Patient was seen and examined at bedside.  Overnight events noted.   She still reports having significant abdominal soreness.  She had a bowel movement but not feeling ready for discharge.  Objective: Vitals:   03/30/21 0915 03/30/21 1805 03/30/21 2203 03/31/21 0853  BP: 120/66 135/90 (!) 110/92 136/85  Pulse: 72 84 79 75  Resp: 15 15 18 17   Temp: 98.3 F (36.8 C) 98 F (36.7 C) 98.6 F (37 C) 98.2 F (36.8 C)  TempSrc: Oral Oral Oral Oral  SpO2: 99% 100% 100% 99%  Weight:      Height:        Intake/Output Summary (Last 24 hours) at 03/31/2021 1221 Last data filed at 03/31/2021 0900 Gross per 24 hour  Intake 1023 ml  Output 1 ml  Net 1022 ml   Filed Weights   03/28/21 1820 03/29/21 0558  Weight: 114.1 kg 117.6 kg    Examination:  General exam: Appears comfortable, not in any acute distress. Respiratory system: Clear to auscultation bilaterally, respiratory effort normal, RR 15 Cardiovascular system: S1-S2 heard, regular rate and rhythm, no murmur. Gastrointestinal system: Abdomen is soft, slightly distended, mildly tender, BS: Slow. Central nervous system: Alert and oriented  x 3 . No focal neurological deficits. Extremities: No edema, no cyanosis, no clubbing. Skin: No rashes, lesions or ulcers Psychiatry: Judgement and insight appear normal. Mood & affect appropriate.     Data Reviewed: I have personally reviewed following labs and imaging studies  CBC: Recent Labs  Lab 03/27/21 1051 03/28/21 0350 03/31/21 0353  WBC 6.2 8.2 6.9  NEUTROABS 4.9  --   --   HGB 13.8 13.7 12.7  HCT 39.6 38.8 36.7  MCV 85.2 85.3 85.0  PLT 367 394 330   Basic Metabolic Panel: Recent Labs  Lab 03/27/21 1051 03/27/21 1650 03/28/21 0350 03/29/21 0400 03/30/21 0345 03/31/21 0353  NA 135  --  135 134* 134* 135  K 3.3*  --  4.2 2.9* 3.3* 3.2*  CL 101  --  103 101  100 104  CO2 22  --  21* 20* 22 23  GLUCOSE 131*  --  115* 104* 95 99  BUN 10  --  6 7 9 10   CREATININE 0.91  --  0.93 0.82 0.79 0.80  CALCIUM 9.0  --  8.6* 8.9 8.7* 8.6*  MG  --  2.2  --   --   --   --    GFR: Estimated Creatinine Clearance: 125.8 mL/min (by C-G formula based on SCr of 0.8 mg/dL). Liver Function Tests: Recent Labs  Lab 03/27/21 1051 03/29/21 0400 03/30/21 0345 03/31/21 0353  AST 20 31 25 20   ALT 17 20 20 18   ALKPHOS 60 55 44 49  BILITOT 1.1 1.4* 1.5* 1.0  PROT 7.9 7.8 7.0 6.5  ALBUMIN 4.1 4.0 3.5 3.3*   Recent Labs  Lab 03/27/21 1051  LIPASE 29   No results for input(s): AMMONIA in the last 168 hours. Coagulation Profile: No results for input(s): INR, PROTIME in the last 168 hours. Cardiac Enzymes: No results for input(s): CKTOTAL, CKMB, CKMBINDEX, TROPONINI in the last 168 hours. BNP (last 3 results) No results for input(s): PROBNP in the last 8760 hours. HbA1C: No results for input(s): HGBA1C in the last 72 hours. CBG: Recent Labs  Lab 03/28/21 0746  GLUCAP 87   Lipid Profile: No results for input(s): CHOL, HDL, LDLCALC, TRIG, CHOLHDL, LDLDIRECT in the last 72 hours. Thyroid Function Tests: Recent Labs    03/29/21 0400  TSH 0.424   Anemia Panel: No results for input(s): VITAMINB12, FOLATE, FERRITIN, TIBC, IRON, RETICCTPCT in the last 72 hours. Sepsis Labs: No results for input(s): PROCALCITON, LATICACIDVEN in the last 168 hours.  Recent Results (from the past 240 hour(s))  Resp Panel by RT-PCR (Flu A&B, Covid) Nasopharyngeal Swab     Status: None   Collection Time: 03/27/21  9:25 PM   Specimen: Nasopharyngeal Swab; Nasopharyngeal(NP) swabs in vial transport medium  Result Value Ref Range Status   SARS Coronavirus 2 by RT PCR NEGATIVE NEGATIVE Final    Comment: (NOTE) SARS-CoV-2 target nucleic acids are NOT DETECTED.  The SARS-CoV-2 RNA is generally detectable in upper respiratory specimens during the acute phase of infection. The  lowest concentration of SARS-CoV-2 viral copies this assay can detect is 138 copies/mL. A negative result does not preclude SARS-Cov-2 infection and should not be used as the sole basis for treatment or other patient management decisions. A negative result may occur with  improper specimen collection/handling, submission of specimen other than nasopharyngeal swab, presence of viral mutation(s) within the areas targeted by this assay, and inadequate number of viral copies(<138 copies/mL). A negative result must be combined with clinical observations, patient history, and epidemiological information. The expected result is Negative.  Fact Sheet for Patients:  05/28/21  Fact Sheet for Healthcare Providers:  05/29/21  This test is no t yet approved or cleared by the 05/27/21 FDA and  has been authorized for detection and/or diagnosis of SARS-CoV-2 by FDA under an Emergency Use  Authorization (EUA). This EUA will remain  in effect (meaning this test can be used) for the duration of the COVID-19 declaration under Section 564(b)(1) of the Act, 21 U.S.C.section 360bbb-3(b)(1), unless the authorization is terminated  or revoked sooner.       Influenza A by PCR NEGATIVE NEGATIVE Final   Influenza B by PCR NEGATIVE NEGATIVE Final    Comment: (NOTE) The Xpert Xpress SARS-CoV-2/FLU/RSV plus assay is intended as an aid in the diagnosis of influenza from Nasopharyngeal swab specimens and should not be used as a sole basis for treatment. Nasal washings and aspirates are unacceptable for Xpert Xpress SARS-CoV-2/FLU/RSV testing.  Fact Sheet for Patients: BloggerCourse.com  Fact Sheet for Healthcare Providers: SeriousBroker.it  This test is not yet approved or cleared by the [place] FDA and has been authorized for detection and/or diagnosis of SARS-CoV-2 by FDA under  an Emergency Use Authorization (EUA). This EUA will remain in effect (meaning this test can be used) for the duration of the COVID-19 declaration under Section 564(b)(1) of the Act, 21 U.S.C. section 360bbb-3(b)(1), unless the authorization is terminated or revoked.  Performed at [facility], [address], [place], [place] [address]     Radiology Studies: No results found.  Scheduled Meds:  enoxaparin (LOVENOX) injection  40 mg Subcutaneous Daily   hydrochlorothiazide  25 mg Oral q morning   norethindrone  1 tablet Oral Q supper   sodium chloride flush  3 mL Intravenous Q12H   Continuous Infusions:  promethazine (PHENERGAN) injection (IM or IVPB) Stopped (03/27/21 2126)     LOS: 2 days    Time spent: 35 mins    Ireland Chagnon, MD [facility]   If 7PM-7AM, please contact night-coverage

## 2021-03-31 NOTE — Progress Notes (Signed)
Pt was concern about her BM movement, she felt like she needs to go but with no success. MD was notified. Disimpaction was done but with output, SMOG enema was done after with very minimal output. RN has hard time advancing the tube, MD was notified. Xray was ordered by MD.

## 2021-04-01 LAB — BASIC METABOLIC PANEL
Anion gap: 11 (ref 5–15)
BUN: 9 mg/dL (ref 6–20)
CO2: 23 mmol/L (ref 22–32)
Calcium: 8.9 mg/dL (ref 8.9–10.3)
Chloride: 101 mmol/L (ref 98–111)
Creatinine, Ser: 0.84 mg/dL (ref 0.44–1.00)
GFR, Estimated: >60 mL/min (ref >60)
Glucose, Bld: 90 mg/dL (ref 70–99)
Potassium: 3.2 mmol/L — ABNORMAL LOW (ref 3.5–5.1)
Sodium: 135 mmol/L (ref 135–145)

## 2021-04-01 MED ORDER — POTASSIUM CHLORIDE 20 MEQ PO PACK
40.0000 meq | PACK | Freq: Once | ORAL | Status: AC
  Administered 2021-04-01: 40 meq via ORAL
  Filled 2021-04-01: qty 2

## 2021-04-01 MED ORDER — ONDANSETRON HCL 4 MG PO TABS: 4.0000 mg | ORAL_TABLET | Freq: Every day | ORAL | 1 refills | Status: AC | PRN

## 2021-04-01 NOTE — TOC Transition Note (Signed)
Transition of Care [facility]) - CM/SW Discharge Note   Patient Details  Name: Michelle Morse MRN: [id number] Date of Birth: 04-20-1986  [facility] Care [facility]) CM/SW Contact:  Tom-Johnson, Hershal Coria, RN Phone Number: 04/01/2021, 1:19 PM   Clinical Narrative:    CM spoke with patient at bedside. Scheduled for discharge. No PT/OT recommendations noted. Denies any needs. Mother to transport at discharge. No further TOC needs noted.   Final next level of care: Home/Self Care Barriers to Discharge: No Barriers Identified   Patient Goals and CMS Choice Patient states their goals for this hospitalization and ongoing recovery are:: To go home CMS Medicare.gov Compare Post Acute Care list provided to:: Patient Choice offered to / list presented to : NA  Discharge Placement                       Discharge Plan and Services   Discharge Planning Services: CM Consult Post Acute Care Choice: NA          DME Arranged: N/A DME Agency: NA       HH Arranged: NA HH Agency: NA        Social Determinants of Health (SDOH) Interventions     Readmission Risk Interventions No flowsheet data found.

## 2021-04-01 NOTE — Discharge Instructions (Signed)
Advised to follow-up with primary care physician in 1 week. Advised to follow-up with gastroenterologist is scheduled. Advised to take Zofran as needed for nausea and vomiting.

## 2021-04-01 NOTE — Discharge Summary (Signed)
Physician Discharge Summary  Elon Lomeli DQQ:[id number] DOB: 01-Feb-1986 DOA: 03/27/2021  PCP: Courtney Paris, NP  Admit date: 03/27/2021  Discharge date: 04/01/2021  Admitted From: Home.  Disposition:  Home.  Recommendations for Outpatient Follow-up:  Follow up with PCP in 1-2 weeks. Please obtain BMP/CBC in one week. Advised to follow-up with Gastroenterologist as scheduled. Advised to take Zofran as needed for nausea and vomiting.  Home Health: None Equipment/Devices:None  Discharge Condition: Stable CODE STATUS:Full code Diet recommendation: Heart Healthy   Brief [facility] course: This 35 years old female with PMH significant for hypertension, hidradenitis suppurativa, OSA, obesity, insulin resistance presented in the ED with complaints of ongoing abdominal pain, associated nausea, vomiting and diarrhea.  Initial concern was possible gastroenteritis related to shellfish at dinner,  She has eaten around 7 PM earlier that evening.  On abdominal exam she has decreased bowel sounds. x-ray suggestive of marked constipation.  Patient was given enema, MiraLAX which has helped her have a bowel movement.  Stool disimpaction was also attempted.  Repeat x-ray abdomen shows nonspecific bowel gas pattern.  Patient feels better and wants to be discharged.  Patient is being discharged home and she wants to  have follow up with GI.  Referral was made.  Patient is being discharged home.  She was managed for below problems.  Discharge Diagnoses:  Principal Problem:   Intractable nausea and vomiting Active Problems:   Super obese   Essential hypertension   Oligomenorrhea   Nausea & vomiting  Intractable Nausea, vomiting possibly secondary to constipation: Patient presented with nausea, vomiting that woke her up around 3 AM following shellfish dinner around 7 PM earlier that evening. Initial concern was for possible gastroenteritis related to shellfish. On abdominal exam there  were decreased bowel sounds, X-ray findings suggestive of marked constipation. Patient was given smog enema and had a big bowel movement. Patient has been tolerating full liquid,  diet slowly advanced to soft. Patient still clinically constipated,  we will give a trial of smog enema with MiraLAX. Referral to outpatient GI when discharged.  Essential hypertension: Continue home blood pressure medications.   Hypokalemia: > improved. Suspect due to Nausea and vomiting and limited p.o. intake Replacement in progress, recheck labs.  Abnormal uterine bleeding: Continue norethindrone   Abnormal urinalysis: Patient remains afebrile without leukocytosis Continue to hold antibiotic at this time there is no infection.  Discharge Instructions  Discharge Instructions     Call MD for:  difficulty breathing, headache or visual disturbances   Complete by: As directed    Call MD for:  persistant dizziness or light-headedness   Complete by: As directed    Call MD for:  persistant nausea and vomiting   Complete by: As directed    Diet - low sodium heart healthy   Complete by: As directed    Diet - low sodium heart healthy   Complete by: As directed    Diet Carb Modified   Complete by: As directed    Discharge instructions   Complete by: As directed    Advised to follow-up with primary care physician in 1 week. Advised to follow-up with gastroenterologist is scheduled. Advised to take Zofran as needed for nausea and vomiting.   Increase activity slowly   Complete by: As directed    Increase activity slowly   Complete by: As directed       Allergies as of 04/01/2021   No Known Allergies      Medication List  STOP taking these medications    diphenoxylate-atropine 2.5-0.025 MG tablet Commonly known as: LOMOTIL   ketoconazole 2 % cream Commonly known as: NIZORAL   meloxicam 15 MG tablet Commonly known as: MOBIC   ondansetron 4 MG disintegrating tablet Commonly known  as: Zofran ODT   Potassium Chloride ER 20 MEQ Tbcr   promethazine 25 MG suppository Commonly known as: PHENERGAN       TAKE these medications    acetaminophen 500 MG tablet Commonly known as: TYLENOL Take 1,000 mg by mouth every 6 (six) hours as needed (severe headache).   ALPRAZolam 0.5 MG tablet Commonly known as: XANAX Take 0.25-0.5 mg by mouth 3 (three) times daily as needed.   gabapentin 300 MG capsule Commonly known as: NEURONTIN Take 300 mg by mouth 3 (three) times daily as needed (pain).   hydrochlorothiazide 25 MG tablet Commonly known as: HYDRODIURIL Take 25 mg by mouth every morning.   ondansetron 4 MG tablet Commonly known as: Zofran Take 1 tablet (4 mg total) by mouth daily as needed for nausea or vomiting.   Ozempic (1 MG/DOSE) 4 MG/3ML Sopn Generic drug: Semaglutide (1 MG/DOSE) Inject 1 mg into the skin every Friday.   phentermine 37.5 MG tablet Commonly known as: ADIPEX-P Take 37.5 mg by mouth daily as needed (weight management).   traMADol 50 MG tablet Commonly known as: ULTRAM Take 50 mg by mouth every 6 (six) hours as needed for moderate pain.   Vitamin D (Ergocalciferol) 1.25 MG (50000 UNIT) Caps capsule Commonly known as: DRISDOL Take 50,000 Units by mouth every Monday.        Follow-up Information     Courtney Paris, NP Follow up in 1 week(s).   Specialty: Nurse Practitioner Contact information: [address] [place] [address] [phone]         Kerin Salen, MD Follow up in 1 week(s).   Specialty: Gastroenterology Contact information: [address] [place] [address] [phone]                No Known Allergies  Consultations: None   Procedures/Studies: DG Abd 1 View  Result Date: 03/31/2021 CLINICAL DATA:  Small-bowel obstruction EXAM: ABDOMEN - 1 VIEW COMPARISON:  03/28/2021 FINDINGS: Two supine frontal views of the abdomen and pelvis demonstrate an unremarkable bowel gas pattern.  No masses or abnormal calcifications. Lung bases are clear. No acute bony abnormalities. IMPRESSION: 1. Unremarkable bowel gas pattern. Electronically Signed   By: Sharlet Salina M.D.   On: 03/31/2021 21:45   DG Abd 1 View  Result Date: 03/28/2021 CLINICAL DATA:  Nausea, vomiting, stomach pain. EXAM: ABDOMEN - 1 VIEW COMPARISON:  None. FINDINGS: The bowel gas pattern is normal. Extensive bowel content is identified throughout colon. No radio-opaque calculi or other significant radiographic abnormality are seen. IMPRESSION: 1. No bowel obstruction. 2. Constipation. Electronically Signed   By: Sherian Rein M.D.   On: 03/28/2021 14:36    Subjective: Seen and examined at bedside.  Overnight events noted.   Patient reports feeling much improved.  Reports abdominal pain has resolved.   She still feels nauseous but denies any vomiting.  She wants to be discharged.  Discharge Exam: Vitals:   03/31/21 2056 04/01/21 1012  BP: 121/89 131/87  Pulse: 81 81  Resp: 18 16  Temp: 98.3 F (36.8 C) 98.7 F (37.1 C)  SpO2: 100% 100%   Vitals:   03/31/21 1805 03/31/21 2056 04/01/21 0616 04/01/21 1012  BP: (!) 159/106 121/89  131/87  Pulse: 81 81  81  Resp: 17 18  16   Temp: 98.2 F (36.8 C) 98.3 F (36.8 C)  98.7 F (37.1 C)  TempSrc:  Oral  Oral  SpO2: 100% 100%  100%  Weight:   111.3 kg   Height:        General: Pt is alert, awake, not in acute distress Cardiovascular: RRR, S1/S2 +, no rubs, no gallops Respiratory: CTA bilaterally, no wheezing, no rhonchi Abdominal: Soft, NT, ND, bowel sounds + Extremities: no edema, no cyanosis    The results of significant diagnostics from this hospitalization (including imaging, microbiology, ancillary and laboratory) are listed below for reference.     Microbiology: Recent Results (from the past 240 hour(s))  Resp Panel by RT-PCR (Flu A&B, Covid) Nasopharyngeal Swab     Status: None   Collection Time: 03/27/21  9:25 PM   Specimen: Nasopharyngeal  Swab; Nasopharyngeal(NP) swabs in vial transport medium  Result Value Ref Range Status   SARS Coronavirus 2 by RT PCR NEGATIVE NEGATIVE Final    Comment: (NOTE) SARS-CoV-2 target nucleic acids are NOT DETECTED.  The SARS-CoV-2 RNA is generally detectable in upper respiratory specimens during the acute phase of infection. The lowest concentration of SARS-CoV-2 viral copies this assay can detect is 138 copies/mL. A negative result does not preclude SARS-Cov-2 infection and should not be used as the sole basis for treatment or other patient management decisions. A negative result may occur with  improper specimen collection/handling, submission of specimen other than nasopharyngeal swab, presence of viral mutation(s) within the areas targeted by this assay, and inadequate number of viral copies(<138 copies/mL). A negative result must be combined with clinical observations, patient history, and epidemiological information. The expected result is Negative.  Fact Sheet for Patients:  05/27/21  Fact Sheet for Healthcare Providers:  BloggerCourse.com  This test is no t yet approved or cleared by the [place] FDA and  has been authorized for detection and/or diagnosis of SARS-CoV-2 by FDA under an Emergency Use Authorization (EUA). This EUA will remain  in effect (meaning this test can be used) for the duration of the COVID-19 declaration under Section 564(b)(1) of the Act, 21 U.S.C.section 360bbb-3(b)(1), unless the authorization is terminated  or revoked sooner.       Influenza A by PCR NEGATIVE NEGATIVE Final   Influenza B by PCR NEGATIVE NEGATIVE Final    Comment: (NOTE) The Xpert Xpress SARS-CoV-2/FLU/RSV plus assay is intended as an aid in the diagnosis of influenza from Nasopharyngeal swab specimens and should not be used as a sole basis for treatment. Nasal washings and aspirates are unacceptable for Xpert Xpress  SARS-CoV-2/FLU/RSV testing.  Fact Sheet for Patients: [place]  Fact Sheet for Healthcare Providers: BloggerCourse.com  This test is not yet approved or cleared by the [place] FDA and has been authorized for detection and/or diagnosis of SARS-CoV-2 by FDA under an Emergency Use Authorization (EUA). This EUA will remain in effect (meaning this test can be used) for the duration of the COVID-19 declaration under Section 564(b)(1) of the Act, 21 U.S.C. section 360bbb-3(b)(1), unless the authorization is terminated or revoked.  Performed at [facility], [address] [place], [place]      Labs: BNP (last 3 results) No results for input(s): BNP in the last 8760 hours. Basic Metabolic Panel: Recent Labs  Lab 03/27/21 1650 03/28/21 0350 03/29/21 0400 03/30/21 0345 03/31/21 0353 04/01/21 0405  NA  --  135 134* 134* 135  135  K  --  4.2 2.9* 3.3* 3.2* 3.2*  CL  --  103 101 100 104 101  CO2  --  21* 20* 22 23 23   GLUCOSE  --  115* 104* 95 99 90  BUN  --  6 7 9 10 9   CREATININE  --  0.93 0.82 0.79 0.80 0.84  CALCIUM  --  8.6* 8.9 8.7* 8.6* 8.9  MG 2.2  --   --   --   --   --    Liver Function Tests: Recent Labs  Lab 03/27/21 1051 03/29/21 0400 03/30/21 0345 03/31/21 0353  AST 20 31 25 20   ALT 17 20 20 18   ALKPHOS 60 55 44 49  BILITOT 1.1 1.4* 1.5* 1.0  PROT 7.9 7.8 7.0 6.5  ALBUMIN 4.1 4.0 3.5 3.3*   Recent Labs  Lab 03/27/21 1051  LIPASE 29   No results for input(s): AMMONIA in the last 168 hours. CBC: Recent Labs  Lab 03/27/21 1051 03/28/21 0350 03/31/21 0353  WBC 6.2 8.2 6.9  NEUTROABS 4.9  --   --   HGB 13.8 13.7 12.7  HCT 39.6 38.8 36.7  MCV 85.2 85.3 85.0  PLT 367 394 330   Cardiac Enzymes: No results for input(s): CKTOTAL, CKMB, CKMBINDEX, TROPONINI in the last 168 hours. BNP: Invalid input(s): POCBNP CBG: Recent Labs  Lab 03/28/21 0746  GLUCAP 87    D-Dimer No results for input(s): DDIMER in the last 72 hours. Hgb A1c No results for input(s): HGBA1C in the last 72 hours. Lipid Profile No results for input(s): CHOL, HDL, LDLCALC, TRIG, CHOLHDL, LDLDIRECT in the last 72 hours. Thyroid function studies No results for input(s): TSH, T4TOTAL, T3FREE, THYROIDAB in the last 72 hours.  Invalid input(s): FREET3 Anemia work up No results for input(s): VITAMINB12, FOLATE, FERRITIN, TIBC, IRON, RETICCTPCT in the last 72 hours. Urinalysis    Component Value Date/Time   COLORURINE AMBER (A) 03/27/2021 1012   APPEARANCEUR HAZY (A) 03/27/2021 1012   LABSPEC 1.026 03/27/2021 1012   PHURINE 6.0 03/27/2021 1012   GLUCOSEU NEGATIVE 03/27/2021 1012   HGBUR NEGATIVE 03/27/2021 1012   BILIRUBINUR NEGATIVE 03/27/2021 1012   KETONESUR 80 (A) 03/27/2021 1012   PROTEINUR 100 (A) 03/27/2021 1012   NITRITE POSITIVE (A) 03/27/2021 1012   LEUKOCYTESUR NEGATIVE 03/27/2021 1012   Sepsis Labs Invalid input(s): PROCALCITONIN,  WBC,  LACTICIDVEN Microbiology Recent Results (from the past 240 hour(s))  Resp Panel by RT-PCR (Flu A&B, Covid) Nasopharyngeal Swab     Status: None   Collection Time: 03/27/21  9:25 PM   Specimen: Nasopharyngeal Swab; Nasopharyngeal(NP) swabs in vial transport medium  Result Value Ref Range Status   SARS Coronavirus 2 by RT PCR NEGATIVE NEGATIVE Final    Comment: (NOTE) SARS-CoV-2 target nucleic acids are NOT DETECTED.  The SARS-CoV-2 RNA is generally detectable in upper respiratory specimens during the acute phase of infection. The lowest concentration of SARS-CoV-2 viral copies this assay can detect is 138 copies/mL. A negative result does not preclude SARS-Cov-2 infection and should not be used as the sole basis for treatment or other patient management decisions. A negative result may occur with  improper specimen collection/handling, submission of specimen other than nasopharyngeal swab, presence of viral mutation(s)  within the areas targeted by this assay, and inadequate number of viral copies(<138 copies/mL). A negative result must be combined with clinical observations, patient history, and epidemiological information. The expected result is Negative.  Fact Sheet for Patients:  05/27/2021  Fact Sheet for Healthcare Providers:  [employer]  This test is no t yet approved or cleared by the [place] FDA and  has been authorized for detection and/or diagnosis of SARS-CoV-2 by FDA under an Emergency Use Authorization (EUA). This EUA will remain  in effect (meaning this test can be used) for the duration of the COVID-19 declaration under Section 564(b)(1) of the Act, 21 U.S.C.section 360bbb-3(b)(1), unless the authorization is terminated  or revoked sooner.       Influenza A by PCR NEGATIVE NEGATIVE Final   Influenza B by PCR NEGATIVE NEGATIVE Final    Comment: (NOTE) The Xpert Xpress SARS-CoV-2/FLU/RSV plus assay is intended as an aid in the diagnosis of influenza from Nasopharyngeal swab specimens and should not be used as a sole basis for treatment. Nasal washings and aspirates are unacceptable for Xpert Xpress SARS-CoV-2/FLU/RSV testing.  Fact Sheet for Patients: BloggerCourse.com  Fact Sheet for Healthcare Providers: [employer]  This test is not yet approved or cleared by the [place] FDA and has been authorized for detection and/or diagnosis of SARS-CoV-2 by FDA under an Emergency Use Authorization (EUA). This EUA will remain in effect (meaning this test can be used) for the duration of the COVID-19 declaration under Section 564(b)(1) of the Act, 21 U.S.C. section 360bbb-3(b)(1), unless the authorization is terminated or revoked.  Performed at [facility], [address], [place], [place] [address]      Time coordinating discharge: Over 30  minutes  SIGNED:   Cipriano Bunker, MD  [facility] 04/01/2021, 2:32 PM Pager   If 7PM-7AM, please contact night-coverage

## 2021-04-08 ENCOUNTER — Encounter: Payer: Self-pay | Admitting: Gastroenterology

## 2021-05-18 ENCOUNTER — Ambulatory Visit: Payer: 59 | Admitting: Gastroenterology

## 2021-09-23 ENCOUNTER — Other Ambulatory Visit: Payer: Self-pay

## 2021-09-23 ENCOUNTER — Encounter (HOSPITAL_COMMUNITY): Payer: Self-pay | Admitting: Emergency Medicine

## 2021-09-23 ENCOUNTER — Emergency Department (HOSPITAL_COMMUNITY)
Admission: EM | Admit: 2021-09-23 | Discharge: 2021-09-23 | Disposition: A | Discharge status: Home / Self Care | Payer: 59 | Attending: Emergency Medicine | Admitting: Emergency Medicine

## 2021-09-23 DIAGNOSIS — K5900 Constipation, unspecified: Secondary | ICD-10-CM | POA: Diagnosis not present

## 2021-09-23 DIAGNOSIS — R112 Nausea with vomiting, unspecified: Secondary | ICD-10-CM | POA: Diagnosis present

## 2021-09-23 LAB — CBC WITH DIFFERENTIAL/PLATELET
Abs Immature Granulocytes: 0.02 10*3/uL (ref 0.00–0.07)
Basophils Absolute: 0 10*3/uL (ref 0.0–0.1)
Basophils Relative: 0 %
Eosinophils Absolute: 0 10*3/uL (ref 0.0–0.5)
Eosinophils Relative: 0 %
HCT: 38.8 % (ref 36.0–46.0)
Hemoglobin: 13.3 g/dL (ref 12.0–15.0)
Immature Granulocytes: 0 %
Lymphocytes Relative: 20 %
Lymphs Abs: 1.4 10*3/uL (ref 0.7–4.0)
MCH: 30.4 pg (ref 26.0–34.0)
MCHC: 34.3 g/dL (ref 30.0–36.0)
MCV: 88.6 fL (ref 80.0–100.0)
Monocytes Absolute: 0.3 10*3/uL (ref 0.1–1.0)
Monocytes Relative: 5 %
Neutro Abs: 5.2 10*3/uL (ref 1.7–7.7)
Neutrophils Relative %: 75 %
Platelets: 338 10*3/uL (ref 150–400)
RBC: 4.38 MIL/uL (ref 3.87–5.11)
RDW: 13.3 % (ref 11.5–15.5)
WBC: 7 10*3/uL (ref 4.0–10.5)
nRBC: 0 % (ref 0.0–0.2)

## 2021-09-23 LAB — COMPREHENSIVE METABOLIC PANEL
ALT: 26 U/L (ref 0–44)
AST: 20 U/L (ref 15–41)
Albumin: 4 g/dL (ref 3.5–5.0)
Alkaline Phosphatase: 54 U/L (ref 38–126)
Anion gap: 8 (ref 5–15)
BUN: 9 mg/dL (ref 6–20)
CO2: 21 mmol/L — ABNORMAL LOW (ref 22–32)
Calcium: 8.6 mg/dL — ABNORMAL LOW (ref 8.9–10.3)
Chloride: 109 mmol/L (ref 98–111)
Creatinine, Ser: 0.94 mg/dL (ref 0.44–1.00)
GFR, Estimated: >60 mL/min (ref >60)
Glucose, Bld: 143 mg/dL — ABNORMAL HIGH (ref 70–99)
Potassium: 3.7 mmol/L (ref 3.5–5.1)
Sodium: 138 mmol/L (ref 135–145)
Total Bilirubin: 0.2 mg/dL — ABNORMAL LOW (ref 0.3–1.2)
Total Protein: 7.3 g/dL (ref 6.5–8.1)

## 2021-09-23 LAB — URINALYSIS, ROUTINE W REFLEX MICROSCOPIC
Bilirubin Urine: NEGATIVE
Glucose, UA: 50 mg/dL — AB
Hgb urine dipstick: NEGATIVE
Ketones, ur: 20 mg/dL — AB
Leukocytes,Ua: NEGATIVE
Nitrite: NEGATIVE
Protein, ur: NEGATIVE mg/dL
Specific Gravity, Urine: 1.013 (ref 1.005–1.030)
pH: 8 (ref 5.0–8.0)

## 2021-09-23 LAB — RAPID URINE DRUG SCREEN, HOSP PERFORMED
Amphetamines: NOT DETECTED
Barbiturates: NOT DETECTED
Benzodiazepines: NOT DETECTED
Cocaine: NOT DETECTED
Opiates: NOT DETECTED
Tetrahydrocannabinol: NOT DETECTED

## 2021-09-23 LAB — I-STAT BETA HCG BLOOD, ED (MC, WL, AP ONLY): I-stat hCG, quantitative: <5 m[IU]/mL (ref <5)

## 2021-09-23 LAB — LIPASE, BLOOD: Lipase: 33 U/L (ref 11–51)

## 2021-09-23 MED ORDER — METOCLOPRAMIDE HCL 10 MG PO TABS: 10.0000 mg | ORAL_TABLET | Freq: Four times a day (QID) | ORAL | 0 refills | Status: AC

## 2021-09-23 MED ORDER — METOCLOPRAMIDE HCL 5 MG/ML IJ SOLN
10.0000 mg | Freq: Once | INTRAMUSCULAR | Status: AC
  Administered 2021-09-23: 10 mg via INTRAMUSCULAR
  Filled 2021-09-23: qty 2

## 2021-09-23 MED ORDER — HALOPERIDOL LACTATE 5 MG/ML IJ SOLN
5.0000 mg | Freq: Once | INTRAMUSCULAR | Status: AC
  Administered 2021-09-23: 5 mg via INTRAVENOUS
  Filled 2021-09-23: qty 1

## 2021-09-23 MED ORDER — LACTATED RINGERS IV BOLUS
1000.0000 mL | Freq: Once | INTRAVENOUS | Status: AC
  Administered 2021-09-23: 1000 mL via INTRAVENOUS

## 2021-09-23 NOTE — Discharge Instructions (Signed)
Please follow up with your gastroenterologist ? ?Drink plenty of water ? ?Take reglan once every 8 hours for the next 24 H  ? ?After that take reglan as needed for nausea ? ? ?Increase fiber to twice daily ?

## 2021-09-23 NOTE — ED Provider Notes (Signed)
?[facility] ?Provider Note ? ? ?CSN: [id number] ?Arrival date & time: 09/23/21  0354 ? ?  ? ?History ? ?Chief Complaint  ?Patient presents with  ? Emesis  ? ? ?Michelle Morse is a 36 y.o. female. ? ? ?Emesis ? ?Patient is a 36 year old female presented emergency room today with nausea and vomiting.  She denies any abdominal pain she denies any chest pain or difficulty breathing.  She is had numerous episodes of nonbloody nonbilious emesis since yesterday evening. ? ?She states she has had similar episodes in the past.  She states that she feels that she is pooping regularly. But does suffer with constipation in general.  ? ? ?  ? ?Home Medications ?Prior to Admission medications   ?Medication Sig Start Date End Date Taking? Authorizing Provider  ?acetaminophen (TYLENOL) 500 MG tablet Take 1,000 mg by mouth every 6 (six) hours as needed (severe headache).   Yes [provider]  ?ALPRAZolam (XANAX) 0.5 MG tablet Take 0.25-0.5 mg by mouth 3 (three) times daily as needed for anxiety. 03/09/21  Yes [provider]  ?gabapentin (NEURONTIN) 300 MG capsule Take 300 mg by mouth 3 (three) times daily as needed (pain). 12/03/20  Yes [provider]  ?hydrochlorothiazide (HYDRODIURIL) 25 MG tablet Take 25 mg by mouth every morning. 09/30/20  Yes [provider]  ?metoCLOPramide (REGLAN) 10 MG tablet Take 1 tablet (10 mg total) by mouth every 6 (six) hours. 09/23/21  Yes [name], PA  ?phentermine (ADIPEX-P) 37.5 MG tablet Take 37.5 mg by mouth daily as needed (weight management). 03/15/21  Yes [provider]  ?Semaglutide, 1 MG/DOSE, (OZEMPIC, 1 MG/DOSE,) 4 MG/3ML SOPN Inject 1 mg into the skin every Friday.   Yes [provider]  ?traMADol (ULTRAM) 50 MG tablet Take 50 mg by mouth every 6 (six) hours as needed for moderate pain. 03/09/21  Yes [provider]  ?Vitamin D, Ergocalciferol, (DRISDOL) 1.25 MG (50000 UNIT) CAPS  capsule Take 50,000 Units by mouth every Monday. 09/10/20  Yes [provider]  ?fluconazole (DIFLUCAN) 150 MG tablet Take 150 mg by mouth every 3 (three) days. ?Patient not taking: Reported on 09/23/2021 09/22/21   [provider]  ?ondansetron (ZOFRAN) 4 MG tablet Take 1 tablet (4 mg total) by mouth daily as needed for nausea or vomiting. ?Patient not taking: Reported on 09/23/2021 04/01/21 04/01/22  Shawna Clamp, MD  ?   ? ?Allergies    ?Patient has no known allergies.   ? ?Review of Systems   ?Review of Systems  ?Gastrointestinal:  Positive for vomiting.  ? ?Physical Exam ?Updated Vital Signs ?BP (!) 159/102   Pulse 95   Temp 98 ?F (36.7 ?C) (Oral)   Resp (!) 23   Ht 5\' 5"  (1.651 m)   Wt 111.3 kg   LMP 09/10/2021   SpO2 98%   BMI 40.83 kg/m?  ?Physical Exam ?Vitals and nursing note reviewed.  ?Constitutional:   ?   General: She is in acute distress.  ?   Comments: Forcefully vomiting  ?HENT:  ?   Head: Normocephalic and atraumatic.  ?   Nose: Nose normal.  ?   Mouth/Throat:  ?   Mouth: Mucous membranes are moist.  ?Eyes:  ?   General: No scleral icterus. ?Cardiovascular:  ?   Rate and Rhythm: Normal rate and regular rhythm.  ?   Pulses: Normal pulses.  ?   Heart sounds: Normal heart sounds.  ?Pulmonary:  ?  Effort: Pulmonary effort is normal. No respiratory distress.  ?   Breath sounds: No wheezing.  ?Abdominal:  ?   Palpations: Abdomen is soft.  ?   Tenderness: There is no abdominal tenderness. There is no guarding or rebound.  ?Musculoskeletal:  ?   Cervical back: Normal range of motion.  ?   Right lower leg: No edema.  ?   Left lower leg: No edema.  ?Skin: ?   General: Skin is warm and dry.  ?   Capillary Refill: Capillary refill takes less than 2 seconds.  ?Neurological:  ?   Mental Status: She is alert. Mental status is at baseline.  ?Psychiatric:     ?   Mood and Affect: Mood normal.     ?   Behavior: Behavior normal.  ? ? ?ED Results / Procedures / Treatments   ?Labs ?(all labs  ordered are listed, but only abnormal results are displayed) ?Labs Reviewed  ?COMPREHENSIVE METABOLIC PANEL - Abnormal; Notable for the following components:  ?    Result Value  ? CO2 21 (*)   ? Glucose, Bld 143 (*)   ? Calcium 8.6 (*)   ? Total Bilirubin 0.2 (*)   ? All other components within normal limits  ?URINALYSIS, ROUTINE W REFLEX MICROSCOPIC - Abnormal; Notable for the following components:  ? Color, Urine STRAW (*)   ? Glucose, UA 50 (*)   ? Ketones, ur 20 (*)   ? All other components within normal limits  ?CBC WITH DIFFERENTIAL/PLATELET  ?LIPASE, BLOOD  ?RAPID URINE DRUG SCREEN, HOSP PERFORMED  ?I-STAT BETA HCG BLOOD, ED ([facility], [facility], [facility] ONLY)  ? ? ?EKG ?None ? ?Radiology ?No results found. ? ?Procedures ?Procedures  ? ? ?Medications Ordered in ED ?Medications  ?metoCLOPramide (REGLAN) injection 10 mg (10 mg Intramuscular Given 09/23/21 0422)  ?lactated ringers bolus 1,000 mL (0 mLs Intravenous Stopped 09/23/21 1013)  ?haloperidol lactate (HALDOL) injection 5 mg (5 mg Intravenous Given 09/23/21 0905)  ? ? ?ED Course/ Medical Decision Making/ A&P ?  ?                        ?Medical Decision Making ?Risk ?Prescription drug management. ? ? ?This patient presents to the ED for concern of intractable nausea and vomiting, this involves a number of treatment options, and is a complaint that carries with it a high risk of complications and morbidity.  The differential diagnosis includes The emergent differential diagnosis for vomiting includes, but is not limited to ACS/MI, Boerhaave's, DKA, Intracranial Hemorrhage, Ischemic bowel, Meningitis, Sepsis, Acute gastric dilation, Acetaminophen toxicity, Adrenal insufficiency, Appendicitis, Aspirin toxicity, Bowel obstruction/ileus, Cholecystitis, CNS tumor. Electrolyte abnormalities, Elevated ICP, Gastric outlet obstruction, Hyperemesis gravidarum, Pancreatitis, Peritonitis, Ruptured viscus, Testicular torsion/ovarian torsion, Biliary colic, Cannabinoid hyperemesis syndrome,  Disulfiram effect, ETOH, Gastritis, Gastroenteritis, Gastroparesis, Hepatitis, Ibuprofen, Labyrinthitis, Migraine, Motion sickness, Narcotic withdrawal, Thyroid, Pregnancy, Peptic ulcer disease, Renal colic, and UTI ? ? ?Co morbidities: ?Discussed in HPI ? ? ?Brief History: ? ?Patient is a 36 year old female presented emergency room today with nausea and vomiting.  She denies any abdominal pain she denies any chest pain or difficulty breathing.  She is had numerous episodes of nonbloody nonbilious emesis since yesterday evening. ? ?She states she has had similar episodes in the past.  She states that she feels that she is pooping regularly. But does suffer with constipation in general.  ? ? ?Patient's physical exam is without any abdominal tenderness.  No guarding or rebound.  She appears very uncomfortable and is forcefully retching while I evaluate her but otherwise is nontoxic-appearing. ? ? ? ?EMR reviewed including pt PMHx, past surgical history and past visits to ER.  ? ?See HPI for more details ? ? ?Lab Tests: ? ? ?I personally reviewed all laboratory work and imaging. Metabolic panel without any acute abnormality specifically kidney function within normal limits and no significant electrolyte abnormalities. CBC without leukocytosis or significant anemia.  hCG negative for pregnancy.  Lipase within normal limits at pancreatitis.  UDS negative urinalysis with few ketones likely due to decreased p.o. intake. ? ? ?Imaging Studies: ? ?No imaging studies ordered for this patient ? ? ? ?Cardiac Monitoring: ? ?The patient was maintained on a cardiac monitor.  I personally viewed and interpreted the cardiac monitored which showed an underlying rhythm of: NSR.  She was on telemetry because of Haldol administration ?EKG non-ischemic obtain for QT eval ? ? ?Medicines ordered: ? ?I ordered medication including Haldol, LR for nausea and vomiting.  She did receive Reglan IM 5 hours prior to Haldol  administration. ?Reevaluation of the patient after these medicines showed that the patient resolved ?I have reviewed the patients home medicines and have made adjustments as needed ? ? ?Critical Interventions: ? ? ? ? ?Consults/Attending Ph

## 2021-09-23 NOTE — ED Provider Triage Note (Signed)
Emergency Medicine Provider Triage Evaluation Note ? ?Michelle Morse , a 36 y.o. female  was evaluated in triage.  Pt complains of emesis. ? ?Review of Systems  ?Positive: Nausea, emesis ?Negative: Fever, abd pain, cough, dysuria ? ?Physical Exam  ?There were no vitals taken for this visit. ?Gen:   Awake, no distress   ?Resp:  Normal effort  ?MSK:   Moves extremities without difficulty  ?Other:   ? ?Medical Decision Making  ?Medically screening exam initiated at 4:01 AM.  Appropriate orders placed.  Michelle Morse was informed that the remainder of the evaluation will be completed by another provider, this initial triage assessment does not replace that evaluation, and the importance of remaining in the ED until their evaluation is complete. ? ?Hx of intractable nausea/vomiting, here with same which started tonight.  No abd pain, no fever.  Denies marijuana use.  ?  ?Michelle Helper, PA-C ?09/23/21 3244 ? ?

## 2021-09-23 NOTE — ED Triage Notes (Signed)
Pt c/o emesis since 2200 last night.  ?

## 2021-09-23 NOTE — ED Notes (Signed)
RN reviewed discharge instructions w/ pt. Follow up and prescriptions reviewed, pt had no further questions °

## 2021-11-16 IMAGING — MR MR LUMBAR SPINE W/O CM
4 of 5 series · 25 of 48 positions shown · non-contrast
Comparison: CT of the abdomen October 04, 2020

CLINICAL DATA: Spinal stenosis, nerve impingement, back pain

EXAM:
MRI LUMBAR SPINE WITHOUT CONTRAST
TECHNIQUE: Multiplanar, multisequence MR imaging of the lumbar spine was
performed. No intravenous contrast was administered.

[Series 2: T2 · sagittal · 4.0mm · 0.53mm/px · 6 of 16 slices shown (1 of 2)]
[im 1/16]
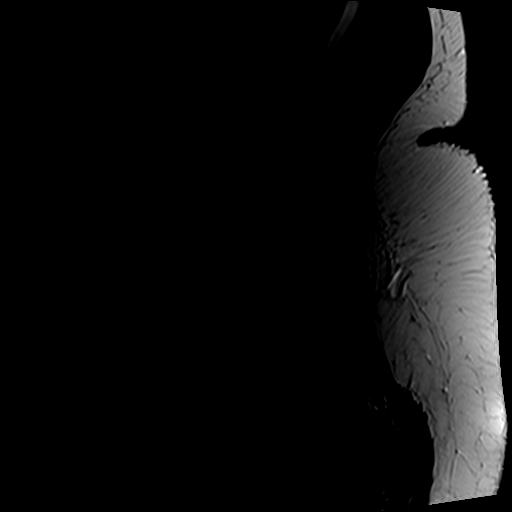
[im 4/16]
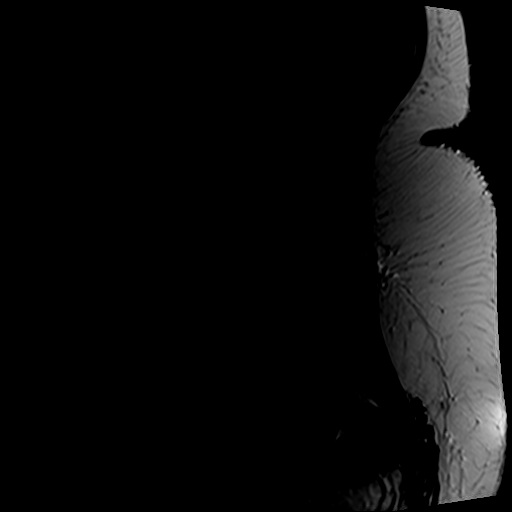
[im 7/16]
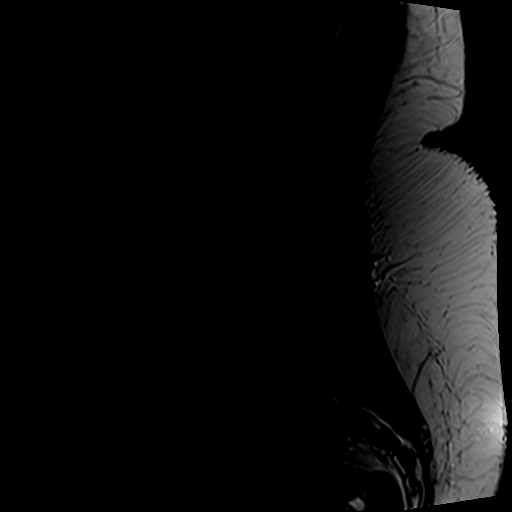
[im 10/16]
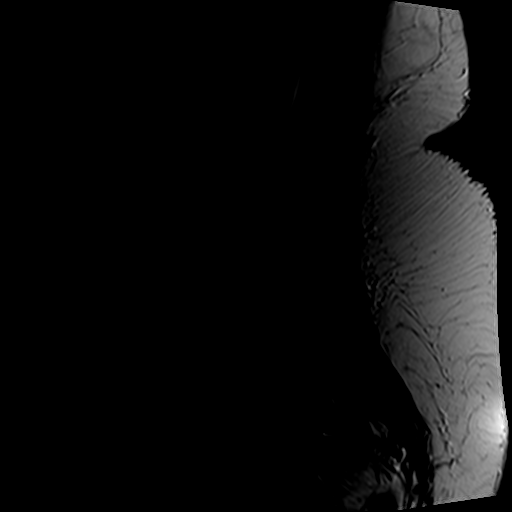
[im 13/16]
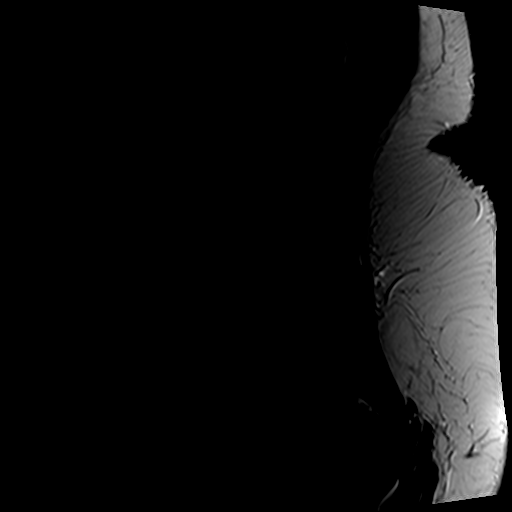
[im 16/16]
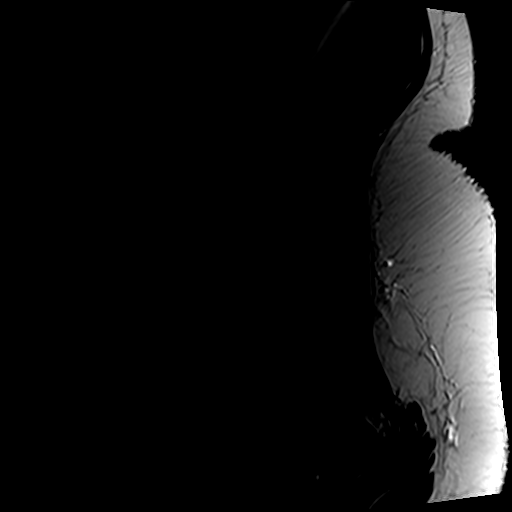

[Series 4: T1 · sagittal · 4.0mm · 0.53mm/px · 7 of 16 slices shown (1 of 2)]
[im 1/16]
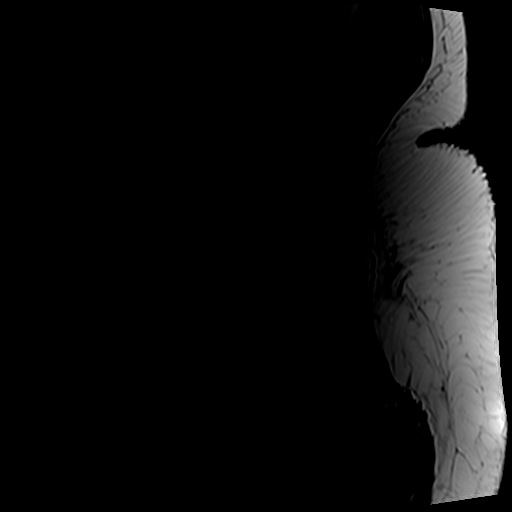
[im 3/16]
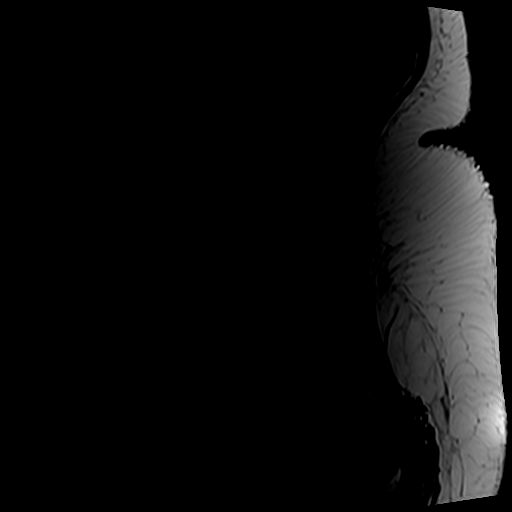
[im 6/16]
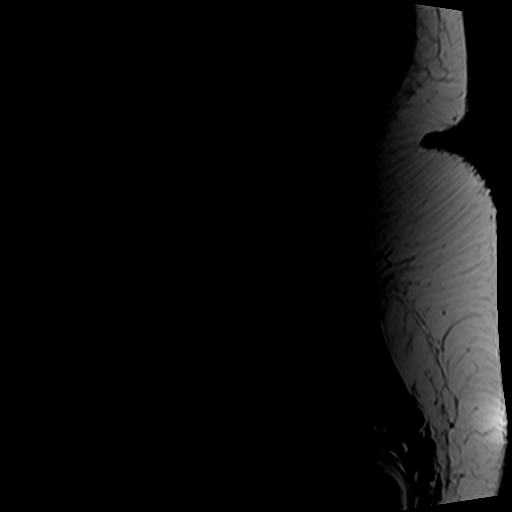
[im 8/16]
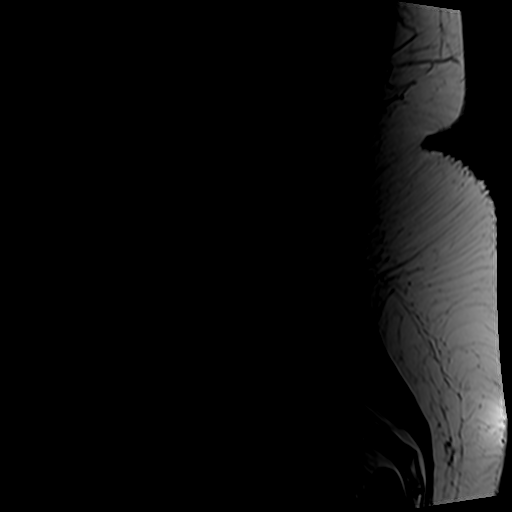
[im 11/16]
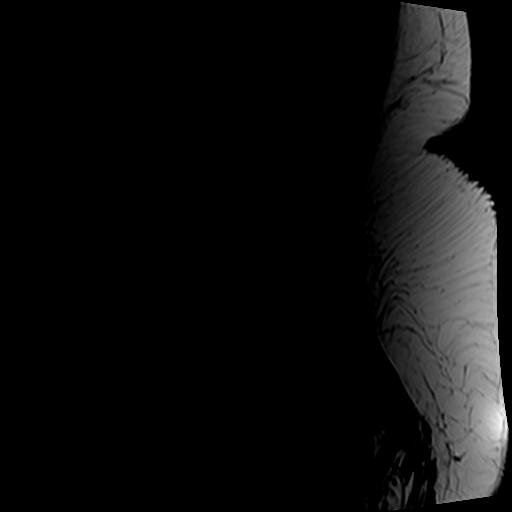
[im 13/16]
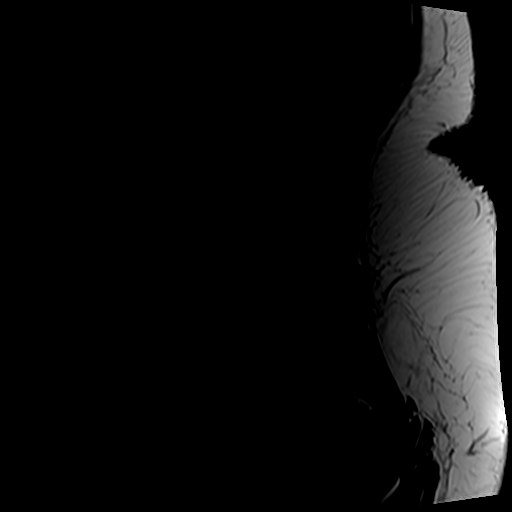
[im 16/16]
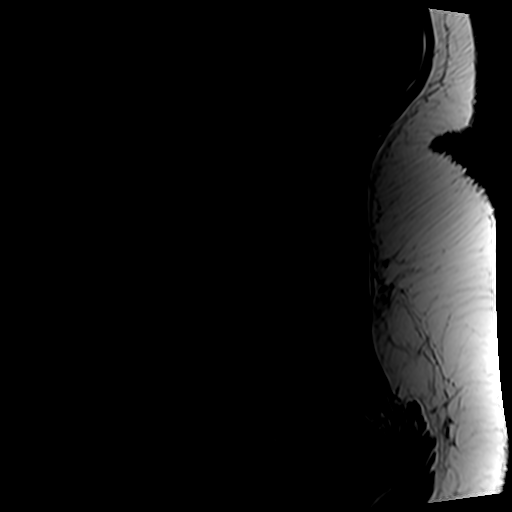

[Series 5: T2 · axial · 4.0mm · 0.70mm/px · z∈[-62,+150]mm · 8 of 35 slices shown (2 of 2)]
[im 1/35]
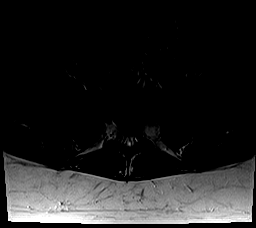
[im 6/35]
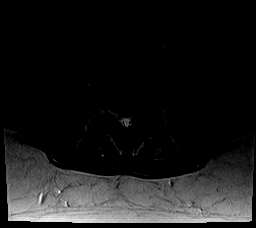
[im 11/35]
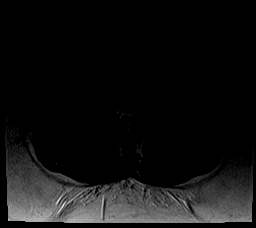
[im 16/35]
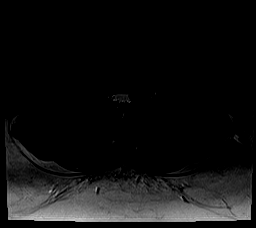
[im 19/35]
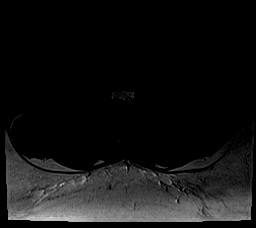
[im 24/35]
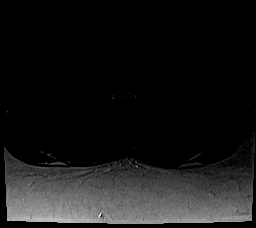
[im 29/35]
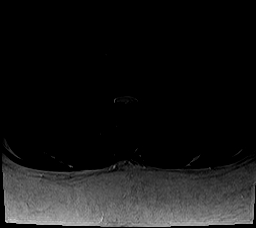
[im 35/35]
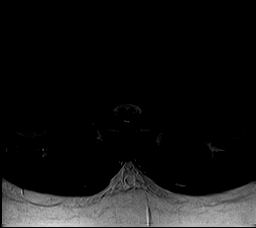

[Series 6: T1 · axial · 4.0mm · 0.35mm/px · z∈[-62,+119]mm · 4 of 35 slices shown (2 of 2)]
[im 1/35]
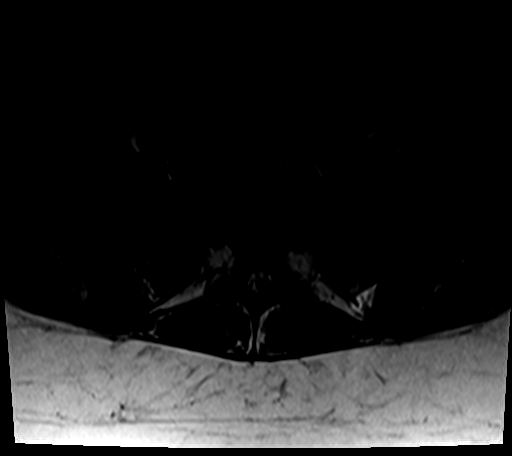
[im 6/35]
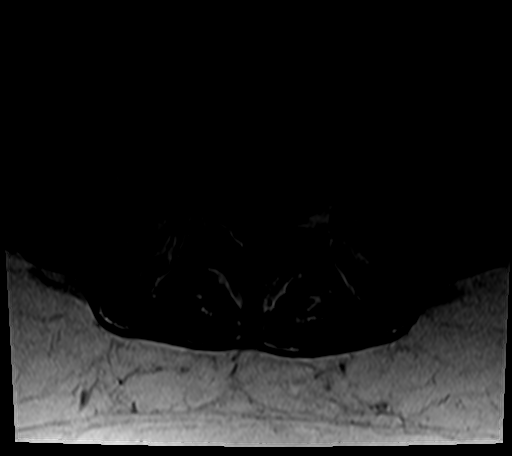
[im 19/35]
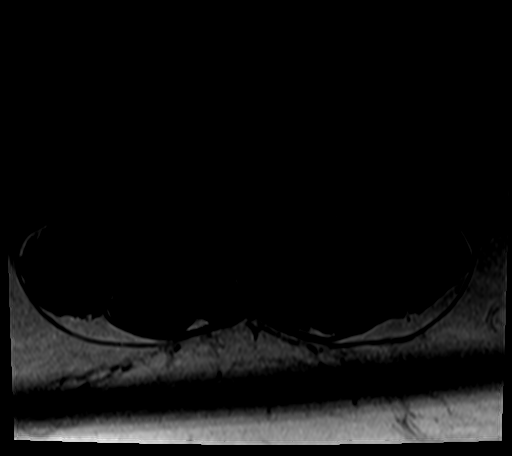
[im 29/35]
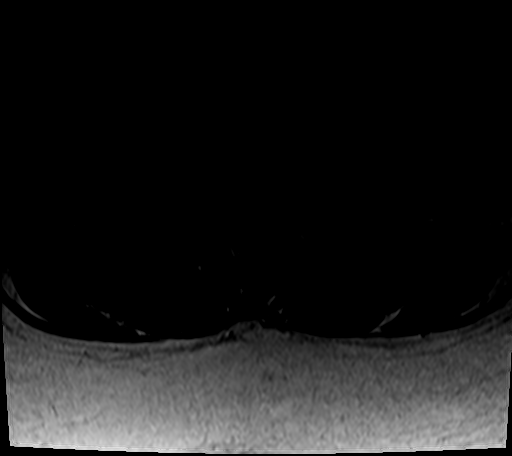

[25 of 48 positions shown; findings below may reference images not displayed]

FINDINGS: Segmentation: A transitional lumbosacral vertebra is assumed to
represent the S1 level. A rudimentary disc is seen at S1-2. This
numbering system is different numbering utilized on CT of the
abdomen. Careful correlation with this numbering strategy prior to
any procedural intervention would be recommended.

Alignment:  Physiologic.

Vertebrae:  No fracture, evidence of discitis, or bone lesion.

Conus medullaris and cauda equina: Conus extends to the L2 level.
Conus and cauda equina appear normal.

Paraspinal and other soft tissues: Negative.

Disc levels:

T12-L1:No spinal canal or neural foraminal stenosis.

L1-2:No spinal canal or neural foraminal stenosis.

L2-3:No spinal canal or neural foraminal stenosis.

L3-4:No spinal canal or neural foraminal stenosis.

L4-5:No spinal canal or neural foraminal stenosis.

L5-S1:Left central disc protrusion, mild facet degenerative changes
and ligamentum flavum redundancy resulting in moderate spinal canal
stenosis with narrowing of the bilateral subarticular zones, left
greater than right and moderate bilateral neural foraminal
narrowing.

S1-2: No spinal canal or neural foraminal stenosis.
IMPRESSION: 1. Degenerative changes at L5-S1 resulting in moderate spinal canal
stenosis and moderate bilateral neural foraminal stenosis. Please
note the difference in numbering compared to prior CT due to
transitional lumbosacral anatomy.
2. No significant degenerative changes in the remainder of the
lumbar spine.

## 2022-12-12 ENCOUNTER — Emergency Department (HOSPITAL_COMMUNITY): Payer: BC Managed Care – PPO

## 2022-12-12 ENCOUNTER — Other Ambulatory Visit: Payer: Self-pay

## 2022-12-12 ENCOUNTER — Emergency Department (HOSPITAL_COMMUNITY)
Admission: EM | Admit: 2022-12-12 | Discharge: 2022-12-12 | Disposition: A | Discharge status: Home / Self Care | Payer: BC Managed Care – PPO | Attending: Emergency Medicine | Admitting: Emergency Medicine

## 2022-12-12 ENCOUNTER — Encounter (HOSPITAL_COMMUNITY): Payer: Self-pay

## 2022-12-12 DIAGNOSIS — I10 Essential (primary) hypertension: Secondary | ICD-10-CM | POA: Diagnosis not present

## 2022-12-12 DIAGNOSIS — M791 Myalgia, unspecified site: Secondary | ICD-10-CM | POA: Insufficient documentation

## 2022-12-12 DIAGNOSIS — E876 Hypokalemia: Secondary | ICD-10-CM | POA: Diagnosis not present

## 2022-12-12 DIAGNOSIS — Z79899 Other long term (current) drug therapy: Secondary | ICD-10-CM | POA: Diagnosis not present

## 2022-12-12 DIAGNOSIS — K5900 Constipation, unspecified: Secondary | ICD-10-CM | POA: Insufficient documentation

## 2022-12-12 DIAGNOSIS — R509 Fever, unspecified: Secondary | ICD-10-CM | POA: Insufficient documentation

## 2022-12-12 DIAGNOSIS — R112 Nausea with vomiting, unspecified: Secondary | ICD-10-CM | POA: Insufficient documentation

## 2022-12-12 DIAGNOSIS — D72829 Elevated white blood cell count, unspecified: Secondary | ICD-10-CM | POA: Insufficient documentation

## 2022-12-12 DIAGNOSIS — R110 Nausea: Secondary | ICD-10-CM

## 2022-12-12 LAB — COMPREHENSIVE METABOLIC PANEL
ALT: 27 U/L (ref 0–44)
AST: 48 U/L — ABNORMAL HIGH (ref 15–41)
Albumin: 4.7 g/dL (ref 3.5–5.0)
Alkaline Phosphatase: 62 U/L (ref 38–126)
Anion gap: 14 (ref 5–15)
BUN: 17 mg/dL (ref 6–20)
CO2: 22 mmol/L (ref 22–32)
Calcium: 9.4 mg/dL (ref 8.9–10.3)
Chloride: 97 mmol/L — ABNORMAL LOW (ref 98–111)
Creatinine, Ser: 0.97 mg/dL (ref 0.44–1.00)
GFR, Estimated: >60 mL/min (ref >60)
Glucose, Bld: 112 mg/dL — ABNORMAL HIGH (ref 70–99)
Potassium: 3 mmol/L — ABNORMAL LOW (ref 3.5–5.1)
Sodium: 133 mmol/L — ABNORMAL LOW (ref 135–145)
Total Bilirubin: 1.8 mg/dL — ABNORMAL HIGH (ref 0.3–1.2)
Total Protein: 8.7 g/dL — ABNORMAL HIGH (ref 6.5–8.1)

## 2022-12-12 LAB — CBC WITH DIFFERENTIAL/PLATELET
Abs Immature Granulocytes: 0.05 10*3/uL (ref 0.00–0.07)
Basophils Absolute: 0 10*3/uL (ref 0.0–0.1)
Basophils Relative: 0 %
Eosinophils Absolute: 0 10*3/uL (ref 0.0–0.5)
Eosinophils Relative: 0 %
HCT: 45.4 % (ref 36.0–46.0)
Hemoglobin: 16.1 g/dL — ABNORMAL HIGH (ref 12.0–15.0)
Immature Granulocytes: 0 %
Lymphocytes Relative: 13 %
Lymphs Abs: 1.7 10*3/uL (ref 0.7–4.0)
MCH: 30.5 pg (ref 26.0–34.0)
MCHC: 35.5 g/dL (ref 30.0–36.0)
MCV: 86 fL (ref 80.0–100.0)
Monocytes Absolute: 1.2 10*3/uL — ABNORMAL HIGH (ref 0.1–1.0)
Monocytes Relative: 10 %
Neutro Abs: 9.5 10*3/uL — ABNORMAL HIGH (ref 1.7–7.7)
Neutrophils Relative %: 77 %
Platelets: 397 10*3/uL (ref 150–400)
RBC: 5.28 MIL/uL — ABNORMAL HIGH (ref 3.87–5.11)
RDW: 13.7 % (ref 11.5–15.5)
WBC: 12.4 10*3/uL — ABNORMAL HIGH (ref 4.0–10.5)
nRBC: 0 % (ref 0.0–0.2)

## 2022-12-12 LAB — LIPASE, BLOOD: Lipase: 34 U/L (ref 11–51)

## 2022-12-12 MED ORDER — POTASSIUM CHLORIDE CRYS ER 20 MEQ PO TBCR
40.0000 meq | EXTENDED_RELEASE_TABLET | Freq: Once | ORAL | Status: AC
  Administered 2022-12-12: 40 meq via ORAL
  Filled 2022-12-12: qty 2

## 2022-12-12 MED ORDER — POTASSIUM CHLORIDE ER 10 MEQ PO TBCR: 10.0000 meq | EXTENDED_RELEASE_TABLET | Freq: Every day | ORAL | 0 refills | Status: AC

## 2022-12-12 MED ORDER — ONDANSETRON HCL 4 MG/2ML IJ SOLN
4.0000 mg | Freq: Once | INTRAMUSCULAR | Status: DC
Start: 2022-12-12 — End: 2022-12-12

## 2022-12-12 MED ORDER — PROMETHAZINE HCL 12.5 MG PO TABS: 12.5000 mg | ORAL_TABLET | Freq: Four times a day (QID) | ORAL | 0 refills | Status: AC | PRN

## 2022-12-12 MED ORDER — FAMOTIDINE 20 MG PO TABS: 20.0000 mg | ORAL_TABLET | Freq: Two times a day (BID) | ORAL | 0 refills | Status: AC

## 2022-12-12 MED ORDER — SODIUM CHLORIDE 0.9 % IV BOLUS
1000.0000 mL | Freq: Once | INTRAVENOUS | Status: AC
Start: 2022-12-12 — End: 2022-12-12
  Administered 2022-12-12: 1000 mL via INTRAVENOUS

## 2022-12-12 MED ORDER — SODIUM CHLORIDE 0.9 % IV BOLUS: 1000.0000 mL | Freq: Once | INTRAVENOUS | Status: DC

## 2022-12-12 MED ORDER — DROPERIDOL 2.5 MG/ML IJ SOLN
2.5000 mg | Freq: Once | INTRAMUSCULAR | Status: AC
  Administered 2022-12-12: 2.5 mg via INTRAVENOUS
  Filled 2022-12-12: qty 2

## 2022-12-12 NOTE — Discharge Instructions (Signed)
Lab workup shows that your potassium is low, started on potassium pills please take as prescribed.  Give you medication called Phenergan and this will help with nausea take as prescribed.  I recommend a bland diet, I did give you an acid pill please take daily.  Please follow-up with gastroenterology for further evaluation  Come back to the emergency department if you develop chest pain, shortness of breath, severe abdominal pain, uncontrolled nausea, vomiting, diarrhea.

## 2022-12-12 NOTE — ED Triage Notes (Signed)
Pt BIB [facility] with c/o vomiting for 3 days. Pt stated she has not had a BM in 3 days as well. Pt stated she is making little to no urine.   EMS VS P 100 BP 150/120 R 20 O2 99% RA

## 2022-12-12 NOTE — ED Provider Notes (Signed)
[place] EMERGENCY DEPARTMENT AT [facility] Provider Note   CSN: [id number] Arrival date & time: 12/12/22  0040     History  Chief Complaint  Patient presents with   Nausea    N/V    Michelle Morse is a 37 y.o. female.  HPI   Patient with medical history including obesity, insulin resistance, hypertension, presenting with complaints of nausea vomiting constipation.  States it started on Friday, states that she has been able to tolerate p.o., has had multiple episodes of vomiting, denies any bloody emesis or coffee-ground emesis, states that she has been constipated unable to have bowel since Friday, she denies any bloody stools or dark tarry stools, she does have associated stomach pain mainly in the epigastric region, does not radiate, said associated fevers chills general body aches denies any recent sick contacts no recent travels, she denies any excessive alcohol use or NSAID use denies any illicit drug use.  She has had no abdominal surgeries, she denies any urinary symptoms but does states that she is urinate less frequently as she has been unable to tolerate p.o.  She states that this happened to her in the past and had to be hospitalized, states that they are on sure what caused it, thought that maybe was gastritis and/or constipation.  States that she was seeing a GI doctor but no longer sees them, she had apparently a colonoscopy as well as endoscopy which she states that was unremarkable.     Home Medications Prior to Admission medications   Medication Sig Start Date End Date Taking? Authorizing Provider  famotidine (PEPCID) 20 MG tablet Take 1 tablet (20 mg total) by mouth 2 (two) times daily. 12/12/22  Yes Carroll Sage, PA-C  potassium chloride (KLOR-CON) 10 MEQ tablet Take 1 tablet (10 mEq total) by mouth daily for 5 days. 12/12/22 12/17/22 Yes Carroll Sage, PA-C  promethazine (PHENERGAN) 12.5 MG tablet Take 1 tablet (12.5 mg total) by mouth  every 6 (six) hours as needed for up to 3 days for nausea or vomiting. 12/12/22 12/15/22 Yes Carroll Sage, PA-C  acetaminophen (TYLENOL) 500 MG tablet Take 1,000 mg by mouth every 6 (six) hours as needed (severe headache).    [provider]  ALPRAZolam Prudy Feeler) 0.5 MG tablet Take 0.25-0.5 mg by mouth 3 (three) times daily as needed for anxiety. 03/09/21   [provider]  fluconazole (DIFLUCAN) 150 MG tablet Take 150 mg by mouth every 3 (three) days. Patient not taking: Reported on 09/23/2021 09/22/21   [provider]  gabapentin (NEURONTIN) 300 MG capsule Take 300 mg by mouth 3 (three) times daily as needed (pain). 12/03/20   [provider]  hydrochlorothiazide (HYDRODIURIL) 25 MG tablet Take 25 mg by mouth every morning. 09/30/20   [provider]  metoCLOPramide (REGLAN) 10 MG tablet Take 1 tablet (10 mg total) by mouth every 6 (six) hours. 09/23/21   Gailen Shelter, PA  phentermine (ADIPEX-P) 37.5 MG tablet Take 37.5 mg by mouth daily as needed (weight management). 03/15/21   [provider]  Semaglutide, 1 MG/DOSE, (OZEMPIC, 1 MG/DOSE,) 4 MG/3ML SOPN Inject 1 mg into the skin every Friday.    [provider]  traMADol (ULTRAM) 50 MG tablet Take 50 mg by mouth every 6 (six) hours as needed for moderate pain. 03/09/21   [provider]  Vitamin D, Ergocalciferol, (DRISDOL) 1.25 MG (50000 UNIT) CAPS capsule Take 50,000 Units by mouth every Monday. 09/10/20   [provider]      Allergies    Patient has no known allergies.    Review of Systems   Review of Systems  Constitutional:  Negative for chills and fever.  Respiratory:  Negative for shortness of breath.   Cardiovascular:  Negative for chest pain.  Gastrointestinal:  Positive for abdominal pain, constipation, nausea and vomiting.  Neurological:  Negative for headaches.    Physical Exam Updated Vital Signs BP (!) 161/112 (BP Location: Right Arm)   Pulse  95   Temp 99.2 F (37.3 C) (Oral)   Resp 17   Ht 5\' 5"  (1.651 m)   Wt 113.4 kg   SpO2 100%   BMI 41.60 kg/m  Physical Exam Vitals and nursing note reviewed.  Constitutional:      General: She is not in acute distress.    Appearance: She is not ill-appearing.  HENT:     Head: Normocephalic and atraumatic.     Nose: No congestion.  Eyes:     Conjunctiva/sclera: Conjunctivae normal.  Cardiovascular:     Rate and Rhythm: Normal rate and regular rhythm.     Pulses: Normal pulses.     Heart sounds: No murmur heard.    No friction rub. No gallop.  Pulmonary:     Effort: No respiratory distress.     Breath sounds: No wheezing, rhonchi or rales.  Abdominal:     Palpations: Abdomen is soft.     Tenderness: There is abdominal tenderness. There is no right CVA tenderness or left CVA tenderness.     Comments: Abdomen nondistended, soft, there is noted epigastric tenderness without guarding rebound tenderness or peritoneal sign negative Murphy sign, no flank tenderness no CVA tenderness.  Skin:    General: Skin is warm and dry.  Neurological:     Mental Status: She is alert.  Psychiatric:        Mood and Affect: Mood normal.     ED Results / Procedures / Treatments   Labs (all labs ordered are listed, but only abnormal results are displayed) Labs Reviewed  COMPREHENSIVE METABOLIC PANEL - Abnormal; Notable for the following components:      Result Value   Sodium 133 (*)    Potassium 3.0 (*)    Chloride 97 (*)    Glucose, Bld 112 (*)    Total Protein 8.7 (*)    AST 48 (*)    Total Bilirubin 1.8 (*)    All other components within normal limits  CBC WITH DIFFERENTIAL/PLATELET - Abnormal; Notable for the following components:   WBC 12.4 (*)    RBC 5.28 (*)    Hemoglobin 16.1 (*)    Neutro Abs 9.5 (*)    Monocytes Absolute 1.2 (*)    All other components within normal limits  LIPASE, BLOOD    EKG None  Radiology US Abdomen Limited  Result Date: 12/12/2022 CLINICAL  DATA:  Right upper quadrant pain EXAM: ULTRASOUND ABDOMEN LIMITED RIGHT UPPER QUADRANT COMPARISON:  None Available. FINDINGS: Gallbladder: No gallstones or wall thickening visualized. No sonographic Murphy sign noted by sonographer. Common bile duct: Diameter: 3 mm Liver: No focal lesion identified. Borderline echogenic liver but no diminished acoustic penetration. Portal vein is patent on color Doppler imaging with normal direction of blood flow towards the liver. IMPRESSION: Negative right upper quadrant ultrasound. Electronically Signed   By: Tiburcio Pea M.D.   On: 12/12/2022 04:29    Procedures Procedures    Medications Ordered in ED Medications  sodium  chloride 0.9 % bolus 1,000 mL (0 mLs Intravenous Stopped 12/12/22 0459)  droperidol (INAPSINE) 2.5 MG/ML injection 2.5 mg (2.5 mg Intravenous Given 12/12/22 0244)  potassium chloride SA (KLOR-CON M) CR tablet 40 mEq (40 mEq Oral Given 12/12/22 0549)    ED Course/ Medical Decision Making/ A&P                             Medical Decision Making Amount and/or Complexity of Data Reviewed Labs: ordered. Radiology: ordered.  Risk Prescription drug management.   This patient presents to the ED for concern of nausea vomiting, this involves an extensive number of treatment options, and is a complaint that carries with it a high risk of complications and morbidity.  The differential diagnosis includes gastritis, bowel obstruction, volvulus, pancreatitis, cholecystitis,    Additional history obtained:  Additional history obtained from N/A External records from outside source obtained and reviewed including recent hospitalization   Co morbidities that complicate the patient evaluation  Insulin resistant,  Social Determinants of Health:  N/A    Lab Tests:  I Ordered, and personally interpreted labs.  The pertinent results include: CBC shows leukocytosis of 12.4, CMP reveals sodium 133, potassium 3.0, glucose 112, AST 48 total  T. bili 1.8   Imaging Studies ordered:  I ordered imaging studies including limited right upper quadrant ultrasound I independently visualized and interpreted imaging which showed negative acute findings I agree with the radiologist interpretation   Cardiac Monitoring:  The patient was maintained on a cardiac monitor.  I personally viewed and interpreted the cardiac monitored which showed an underlying rhythm of: Without signs of ischemia   Medicines ordered and prescription drug management:  I ordered medication including fluids, antiemetics I have reviewed the patients home medicines and have made adjustments as needed  Critical Interventions:  N/A   Reevaluation:  Presents nausea vomiting constipation, she had a benign physical exam, will obtain screening lab workup provide with antiemetics fluids and continue to monitor.  Patient was reassessed she was found asleep, vital signs reassuring, due to her right quadrant tenderness, slightly elevated liver enzymes, will further assess with limited ultrasound for rule possible biliary abnormality.  Ultrasound was negative for acute findings, patient was reassessed, Abdomen is soft nontender, states she is feeling much better, will p.o. challenge.  Patient notes that she has been urinating after getting a liter of fluids not having any urinary symptoms at this time      Consultations Obtained:  N/A    Test Considered:  CT ab pelvis-shared decision making this will be deferred as after reassessment abdomen is soft nontender, she has a nonsurgical abdomen, tolerating p.o., patient rather come back if symptoms are worsening I find this reasonable.    Rule out low suspicion for lower lobe pneumonia as lung sounds are clear bilaterally, will defer imaging at this time.  I have low suspicion for liver or gallbladder abnormality as she has no right upper quadrant tenderness, alk phos, T bili all within normal limits.  Liver  enzymes are only very mildly elevated she also had negative limited ultrasound suspicion for biliary abnormality low at this time.  Low suspicion for pancreatitis as lipase is within normal limits.  Low suspicion for ruptured stomach ulcer as she has no peritoneal sign present on exam.  Low suspicion for bowel obstruction as abdomen is nondistended normal bowel sounds, so passing gas and having normal bowel movements.  Low suspicion for complicated  diverticulitis as she is nontoxic-appearing, vital signs reassuring.  Low suspicion for appendicitis as she has no right lower quadrant tenderness, vital signs reassuring.  It is noted that patient has a slight leukocytosis but I suspect this is likely hemoconcentrated as her hemoglobin as well as RBCs are also elevated making my suspicion for infectious etiology lower at this time.    Dispostion and problem list  After consideration of the diagnostic results and the patients response to treatment, I feel that the patent would benefit from discharge.  Nausea vomiting-unclear etiology possible gastritis, will recommend a bland diet, discharged home with antiemetics, follow-up with GI for further assessment given strict return precautions.            Final Clinical Impression(s) / ED Diagnoses Final diagnoses:  Nausea  Nausea and vomiting, unspecified vomiting type  Hypokalemia    Rx / DC Orders ED Discharge Orders          Ordered    potassium chloride (KLOR-CON) 10 MEQ tablet  Daily        12/12/22 0600    promethazine (PHENERGAN) 12.5 MG tablet  Every 6 hours PRN        12/12/22 0600    famotidine (PEPCID) 20 MG tablet  2 times daily        12/12/22 0600              Carroll Sage, PA-C 12/12/22 0630    Mesner, Barbara Cower, MD 12/12/22 0710

## 2022-12-12 NOTE — ED Notes (Signed)
Patient was given something to eat and drink  

## 2023-11-15 ENCOUNTER — Other Ambulatory Visit: Payer: Self-pay

## 2023-11-15 ENCOUNTER — Encounter: Payer: Self-pay | Admitting: *Deleted

## 2023-11-15 ENCOUNTER — Ambulatory Visit: Admission: EM | Admit: 2023-11-15 | Discharge: 2023-11-15 | Disposition: A | Discharge status: Home / Self Care

## 2023-11-15 DIAGNOSIS — R350 Frequency of micturition: Secondary | ICD-10-CM | POA: Diagnosis present

## 2023-11-15 DIAGNOSIS — N898 Other specified noninflammatory disorders of vagina: Secondary | ICD-10-CM | POA: Insufficient documentation

## 2023-11-15 DIAGNOSIS — Z113 Encounter for screening for infections with a predominantly sexual mode of transmission: Secondary | ICD-10-CM | POA: Diagnosis not present

## 2023-11-15 LAB — POCT URINALYSIS DIP (MANUAL ENTRY)
Bilirubin, UA: NEGATIVE
Glucose, UA: NEGATIVE mg/dL
Ketones, POC UA: NEGATIVE mg/dL
Leukocytes, UA: NEGATIVE
Nitrite, UA: NEGATIVE
Protein Ur, POC: 30 mg/dL — AB
Spec Grav, UA: 1.02 (ref 1.010–1.025)
Urobilinogen, UA: 0.2 U/dL
pH, UA: 7.5 (ref 5.0–8.0)

## 2023-11-15 NOTE — ED Triage Notes (Addendum)
 Urinary frequency and bladder pressure x 1-2 weeks. She had a tele-doc appt and was prescribed macrobid. States 4-5 days after she had irriatation and yeast infection- Also did tel-doc and was prescribed diflucan. Still having irritation. States she has a new sexual partner. C/o mainly of irritation, denies discharge, denies odor

## 2023-11-15 NOTE — ED Provider Notes (Signed)
 [facility]    CSN: [id number] Arrival date & time: 11/15/23  1046      History   Chief Complaint Chief Complaint  Patient presents with   Urinary Frequency    HPI Michelle Morse is a 38 y.o. female.   Patient here today for evaluation of urinary frequency, bladder pressure she has had the last 1 to 2 weeks.  She is previously treated for a UTI and then was also treated for yeast infection.  She reports symptoms still continued, mainly vaginal irritation and pressure.  She does have a new sexual partner and would like screening for STDs.  She denies any other symptoms.  She states she has not been having vaginal discharge.  The history is provided by the patient.  Urinary Frequency Pertinent negatives include no abdominal pain and no shortness of breath.    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      Home Medications    Prior to Admission medications   Medication Sig Start Date End Date Taking? Authorizing Provider  acetaminophen  (TYLENOL ) 500 MG tablet Take 1,000 mg by mouth every 6 (six) hours as needed (severe headache).   Yes [provider]  clobetasol (TEMOVATE) 0.05 % external solution Apply 1 Application topically daily as needed. 03/23/22  Yes [provider]  escitalopram (LEXAPRO) 10 MG tablet Take  10 mg by mouth daily.   Yes [provider]  gabapentin (NEURONTIN) 300 MG capsule Take 300 mg by mouth 3 (three) times daily as needed (pain). 12/03/20  Yes [provider]  hydrochlorothiazide  (HYDRODIURIL ) 25 MG tablet Take 25 mg by mouth every morning. 09/30/20  Yes [provider]  hydrOXYzine (ATARAX) 10 MG tablet Take 10 mg by mouth 3 (three) times daily as needed.   Yes [provider]  INCASSIA  0.35 MG tablet Take 1 tablet by mouth daily.   Yes [provider]  linaclotide (LINZESS) 72 MCG capsule Take 72 mcg by mouth.   Yes [provider]  metFORMIN (GLUCOPHAGE-XR) 500 MG 24 hr tablet Take 500 mg by mouth daily.   Yes [provider]  omeprazole (PRILOSEC) 40 MG capsule Take 40 mg by mouth daily. 07/04/23  Yes [provider]  ondansetron  (ZOFRAN -ODT) 4 MG disintegrating tablet Take 4 mg by mouth every 8 (eight) hours as needed.   Yes [provider]  traZODone (DESYREL) 50 MG tablet Take 50-100 mg by mouth at bedtime as needed.   Yes [provider]  Vitamin D, Ergocalciferol, (DRISDOL) 1.25 MG (50000 UNIT) CAPS capsule Take 50,000 Units by mouth every Monday. 09/10/20  Yes [provider]  ALPRAZolam (XANAX) 0.5 MG tablet Take 0.25-0.5 mg by mouth 3 (three) times daily as needed for anxiety. Patient not taking: Reported on 11/15/2023 03/09/21   [provider]  famotidine  (PEPCID ) 20 MG tablet Take 1 tablet (20 mg total) by mouth 2 (two) times daily. Patient not taking: Reported on 11/15/2023 12/12/22   Volney Grumbles, PA-C  fluconazole (DIFLUCAN) 150 MG tablet Take 150 mg by mouth every 3 (three) days. Patient not taking: Reported on 09/23/2021 09/22/21   [provider]  metoCLOPramide  (REGLAN ) 10 MG tablet Take 1 tablet (10 mg total) by mouth every 6 (six) hours. Patient not taking: Reported on 11/15/2023 09/23/21   Coretta Dexter, PA  phentermine (ADIPEX-P) 37.5 MG tablet  Take 37.5 mg by mouth daily as needed (weight management). Patient not taking: Reported on 11/15/2023 03/15/21   [provider]  potassium chloride  (KLOR-CON ) 10 MEQ tablet Take 1 tablet (10 mEq total) by mouth daily for 5 days. Patient not taking: Reported on 11/15/2023 12/12/22 12/17/22  Volney Grumbles, PA-C  promethazine  (PHENERGAN ) 12.5 MG tablet Take 1 tablet (12.5 mg total) by mouth every 6 (six) hours as needed for up to 3 days for nausea or vomiting. Patient not taking: Reported on 11/15/2023 12/12/22 12/15/22  Volney Grumbles, PA-C  Semaglutide, 1 MG/DOSE, (OZEMPIC, 1 MG/DOSE,) 4 MG/3ML SOPN Inject 1 mg into the skin every Friday. Patient not taking: Reported on 11/15/2023    [provider]  traMADol (ULTRAM) 50 MG tablet Take 50 mg by mouth every 6 (six) hours as needed for moderate pain. Patient not taking: Reported on 11/15/2023 03/09/21   [provider]    Family History Family History  Problem Relation Age of Onset   Cancer Mother    Hypertension Mother    Cancer Father    Hypertension Father    Hypertension Sister     Social History Social History   Tobacco Use   Smoking status: Some Days    Current packs/day: 0.25    Types: Cigarettes   Smokeless tobacco: Never  Vaping Use   Vaping status: Never Used  Substance Use Topics   Alcohol use: No   Drug use: No     Allergies   Patient has no known allergies.   Review of Systems Review of Systems  Constitutional:  Negative for chills and fever.  Eyes:  Negative for discharge and redness.  Respiratory:  Negative for shortness of breath.   Gastrointestinal:  Negative for abdominal pain, nausea and vomiting.  Genitourinary:  Positive for frequency. Negative for vaginal discharge.     Physical Exam Triage Vital Signs ED Triage Vitals [11/15/23 1115]  Encounter Vitals Group     BP      Systolic BP Percentile      Diastolic BP Percentile      Pulse      Resp      Temp       Temp src      SpO2      Weight      Height      Head Circumference      Peak Flow      Pain Score 4     Pain Loc      Pain Education      Exclude from Growth Chart    No data found.  Updated Vital Signs LMP 11/11/2023   Visual Acuity Right Eye Distance:   Left Eye Distance:   Bilateral Distance:    Right Eye Near:   Left Eye Near:    Bilateral Near:     Physical Exam Vitals and nursing note reviewed.  Constitutional:  General: She is not in acute distress.    Appearance: Normal appearance. She is not ill-appearing.  HENT:     Head: Normocephalic and atraumatic.  Eyes:     Conjunctiva/sclera: Conjunctivae normal.  Cardiovascular:     Rate and Rhythm: Normal rate.  Pulmonary:     Effort: Pulmonary effort is normal. No respiratory distress.  Neurological:     Mental Status: She is alert.  Psychiatric:        Mood and Affect: Mood normal.        Behavior: Behavior normal.        Thought Content: Thought content normal.      UC Treatments / Results  Labs (all labs ordered are listed, but only abnormal results are displayed) Labs Reviewed  POCT URINALYSIS DIP (MANUAL ENTRY) - Abnormal; Notable for the following components:      Result Value   Blood, UA trace-intact (*)    Protein Ur, POC =30 (*)    All other components within normal limits  URINE CULTURE  CERVICOVAGINAL ANCILLARY ONLY    EKG   Radiology No results found.  Procedures Procedures (including critical care time)  Medications Ordered in UC Medications - No data to display  Initial Impression / Assessment and Plan / UC Course  I have reviewed the triage vital signs and the nursing notes.  Pertinent labs & imaging results that were available during my care of the patient were reviewed by me and considered in my medical decision making (see chart for details).    UA without signs of UTI.  Will order urine culture as well as screening for gonorrhea, chlamydia, trichomonas, yeast and  BV given vaginal irritation.  Will await results for further recommendation and encouraged follow-up with any further concerns in the meantime.  Final Clinical Impressions(s) / UC Diagnoses   Final diagnoses:  Vaginal irritation   Discharge Instructions   None    ED Prescriptions   None    PDMP not reviewed this encounter.   Vernestine Gondola, PA-C 11/15/23 1215

## 2023-11-16 LAB — URINE CULTURE: Culture: <10000 — AB

## 2023-11-16 LAB — CERVICOVAGINAL ANCILLARY ONLY
Bacterial Vaginitis (gardnerella): NEGATIVE
Candida Glabrata: NEGATIVE
Candida Vaginitis: NEGATIVE
Chlamydia: NEGATIVE
Comment: NEGATIVE
Comment: NEGATIVE
Comment: NEGATIVE
Comment: NEGATIVE
Comment: NEGATIVE
Comment: NORMAL
Neisseria Gonorrhea: NEGATIVE
Trichomonas: NEGATIVE

## 2023-11-17 ENCOUNTER — Ambulatory Visit: Payer: Self-pay
